# Patient Record
Sex: Male | Born: 1980 | Race: White | Hispanic: No | Marital: Single | Smoking: Former smoker
Health system: Southern US, Community
[De-identification: ages and names within clinical notes are randomized; demographics above are authoritative.]

## PROBLEM LIST (undated history)

## (undated) DIAGNOSIS — F988 Other specified behavioral and emotional disorders with onset usually occurring in childhood and adolescence: Secondary | ICD-10-CM

## (undated) DIAGNOSIS — I10 Essential (primary) hypertension: Secondary | ICD-10-CM

## (undated) DIAGNOSIS — L409 Psoriasis, unspecified: Secondary | ICD-10-CM

## (undated) DIAGNOSIS — F419 Anxiety disorder, unspecified: Secondary | ICD-10-CM

## (undated) HISTORY — PX: OTHER SURGICAL HISTORY: SHX169

---
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
## 2004-04-28 ENCOUNTER — Emergency Department (HOSPITAL_COMMUNITY): Admission: EM | Admit: 2004-04-28 | Discharge: 2004-04-29 | Payer: Self-pay | Admitting: Emergency Medicine

## 2004-04-29 ENCOUNTER — Emergency Department (HOSPITAL_COMMUNITY): Admission: EM | Admit: 2004-04-29 | Discharge: 2004-04-30 | Payer: Self-pay | Admitting: Emergency Medicine

## 2004-04-30 ENCOUNTER — Emergency Department (HOSPITAL_COMMUNITY): Admission: EM | Admit: 2004-04-30 | Discharge: 2004-04-30 | Payer: Self-pay | Admitting: Emergency Medicine

## 2004-05-01 ENCOUNTER — Inpatient Hospital Stay (HOSPITAL_COMMUNITY): Admission: EM | Admit: 2004-05-01 | Discharge: 2004-05-04 | Payer: Self-pay | Admitting: Emergency Medicine

## 2004-05-03 ENCOUNTER — Encounter (INDEPENDENT_AMBULATORY_CARE_PROVIDER_SITE_OTHER): Payer: Self-pay | Admitting: *Deleted

## 2007-06-19 ENCOUNTER — Emergency Department (HOSPITAL_COMMUNITY): Admission: EM | Admit: 2007-06-19 | Discharge: 2007-06-19 | Payer: Self-pay | Admitting: Emergency Medicine

## 2008-06-25 ENCOUNTER — Emergency Department (HOSPITAL_COMMUNITY): Admission: EM | Admit: 2008-06-25 | Discharge: 2008-06-25 | Payer: Self-pay | Admitting: Emergency Medicine

## 2008-06-25 ENCOUNTER — Encounter (INDEPENDENT_AMBULATORY_CARE_PROVIDER_SITE_OTHER): Payer: Self-pay | Admitting: *Deleted

## 2008-06-28 ENCOUNTER — Ambulatory Visit: Payer: Self-pay | Admitting: Gastroenterology

## 2008-06-29 ENCOUNTER — Inpatient Hospital Stay (HOSPITAL_COMMUNITY): Admission: EM | Admit: 2008-06-29 | Discharge: 2008-07-01 | Payer: Self-pay | Admitting: Emergency Medicine

## 2008-06-30 ENCOUNTER — Telehealth: Payer: Self-pay | Admitting: Gastroenterology

## 2008-07-01 ENCOUNTER — Inpatient Hospital Stay (HOSPITAL_COMMUNITY): Admission: EM | Admit: 2008-07-01 | Discharge: 2008-07-03 | Payer: Self-pay | Admitting: Emergency Medicine

## 2008-07-04 ENCOUNTER — Encounter: Payer: Self-pay | Admitting: Gastroenterology

## 2008-07-13 ENCOUNTER — Encounter (INDEPENDENT_AMBULATORY_CARE_PROVIDER_SITE_OTHER): Payer: Self-pay | Admitting: *Deleted

## 2008-07-17 ENCOUNTER — Ambulatory Visit: Payer: Self-pay | Admitting: Gastroenterology

## 2008-08-07 ENCOUNTER — Emergency Department (HOSPITAL_COMMUNITY): Admission: EM | Admit: 2008-08-07 | Discharge: 2008-08-07 | Payer: Self-pay | Admitting: Emergency Medicine

## 2008-08-08 ENCOUNTER — Emergency Department (HOSPITAL_COMMUNITY): Admission: EM | Admit: 2008-08-08 | Discharge: 2008-08-08 | Payer: Self-pay | Admitting: Emergency Medicine

## 2008-08-08 ENCOUNTER — Emergency Department (HOSPITAL_COMMUNITY): Admission: EM | Admit: 2008-08-08 | Discharge: 2008-08-09 | Payer: Self-pay | Admitting: Emergency Medicine

## 2008-08-09 ENCOUNTER — Ambulatory Visit: Payer: Self-pay | Admitting: Internal Medicine

## 2008-08-09 ENCOUNTER — Inpatient Hospital Stay (HOSPITAL_COMMUNITY): Admission: EM | Admit: 2008-08-09 | Discharge: 2008-08-10 | Payer: Self-pay | Admitting: *Deleted

## 2008-08-11 ENCOUNTER — Emergency Department (HOSPITAL_COMMUNITY): Admission: EM | Admit: 2008-08-11 | Discharge: 2008-08-11 | Payer: Self-pay | Admitting: Emergency Medicine

## 2008-09-12 ENCOUNTER — Emergency Department (HOSPITAL_COMMUNITY): Admission: EM | Admit: 2008-09-12 | Discharge: 2008-09-12 | Payer: Self-pay | Admitting: Emergency Medicine

## 2008-09-14 ENCOUNTER — Emergency Department (HOSPITAL_COMMUNITY): Admission: EM | Admit: 2008-09-14 | Discharge: 2008-09-14 | Payer: Self-pay | Admitting: Emergency Medicine

## 2008-11-03 ENCOUNTER — Emergency Department (HOSPITAL_COMMUNITY): Admission: EM | Admit: 2008-11-03 | Discharge: 2008-11-03 | Payer: Self-pay | Admitting: Emergency Medicine

## 2008-11-04 ENCOUNTER — Emergency Department (HOSPITAL_COMMUNITY): Admission: EM | Admit: 2008-11-04 | Discharge: 2008-11-04 | Payer: Self-pay | Admitting: Emergency Medicine

## 2008-11-08 ENCOUNTER — Ambulatory Visit (HOSPITAL_COMMUNITY): Admission: RE | Admit: 2008-11-08 | Discharge: 2008-11-08 | Payer: Self-pay | Admitting: Gastroenterology

## 2009-12-10 ENCOUNTER — Emergency Department (HOSPITAL_COMMUNITY): Admission: EM | Admit: 2009-12-10 | Discharge: 2009-12-10 | Payer: Self-pay | Admitting: Family Medicine

## 2010-06-02 ENCOUNTER — Emergency Department (HOSPITAL_COMMUNITY)
Admission: EM | Admit: 2010-06-02 | Discharge: 2010-06-02 | Payer: Self-pay | Source: Home / Self Care | Admitting: Emergency Medicine

## 2010-09-09 LAB — DIFFERENTIAL
Basophils Absolute: 0 10*3/uL (ref 0.0–0.1)
Basophils Absolute: 0 10*3/uL (ref 0.0–0.1)
Basophils Relative: 0 % (ref 0–1)
Eosinophils Absolute: 0 10*3/uL (ref 0.0–0.7)
Eosinophils Relative: 0 % (ref 0–5)
Eosinophils Relative: 0 % (ref 0–5)
Lymphocytes Relative: 19 % (ref 12–46)
Lymphocytes Relative: 14 % (ref 12–46)
Lymphs Abs: 1.8 10*3/uL (ref 0.7–4.0)
Lymphs Abs: 1.3 10*3/uL (ref 0.7–4.0)
Monocytes Absolute: 0.3 10*3/uL (ref 0.1–1.0)
Monocytes Relative: 4 % (ref 3–12)
Neutro Abs: 7.2 10*3/uL (ref 1.7–7.7)
Neutro Abs: 7.4 10*3/uL (ref 1.7–7.7)
Neutrophils Relative %: 77 % (ref 43–77)

## 2010-09-09 LAB — CBC
HCT: 41.5 % (ref 39.0–52.0)
HCT: 42.1 % (ref 39.0–52.0)
Hemoglobin: 14.6 g/dL (ref 13.0–17.0)
Hemoglobin: 14.4 g/dL (ref 13.0–17.0)
MCHC: 35.1 g/dL (ref 30.0–36.0)
MCHC: 34.2 g/dL (ref 30.0–36.0)
MCV: 91.3 fL (ref 78.0–100.0)
MCV: 91.4 fL (ref 78.0–100.0)
Platelets: 281 10*3/uL (ref 150–400)
Platelets: 297 10*3/uL (ref 150–400)
RBC: 4.54 MIL/uL (ref 4.22–5.81)
RBC: 4.61 MIL/uL (ref 4.22–5.81)
RDW: 13.1 % (ref 11.5–15.5)
RDW: 12.8 % (ref 11.5–15.5)
WBC: 9.4 10*3/uL (ref 4.0–10.5)
WBC: 9.1 10*3/uL (ref 4.0–10.5)

## 2010-09-09 LAB — COMPREHENSIVE METABOLIC PANEL
ALT: 14 U/L (ref 0–53)
ALT: 16 U/L (ref 0–53)
AST: 22 U/L (ref 0–37)
AST: 21 U/L (ref 0–37)
Albumin: 4.8 g/dL (ref 3.5–5.2)
Albumin: 4.8 g/dL (ref 3.5–5.2)
Alkaline Phosphatase: 52 U/L (ref 39–117)
Alkaline Phosphatase: 51 U/L (ref 39–117)
BUN: 14 mg/dL (ref 6–23)
BUN: 16 mg/dL (ref 6–23)
CO2: 23 mEq/L (ref 19–32)
CO2: 24 mEq/L (ref 19–32)
Calcium: 10 mg/dL (ref 8.4–10.5)
Calcium: 9.8 mg/dL (ref 8.4–10.5)
Chloride: 107 mEq/L (ref 96–112)
Chloride: 108 mEq/L (ref 96–112)
Creatinine, Ser: 0.96 mg/dL (ref 0.4–1.5)
Creatinine, Ser: 1.11 mg/dL (ref 0.4–1.5)
GFR calc Af Amer: >60 mL/min (ref >60)
GFR calc Af Amer: >60 mL/min (ref >60)
GFR calc non Af Amer: >60 mL/min (ref >60)
GFR calc non Af Amer: >60 mL/min (ref >60)
Glucose, Bld: 124 mg/dL — ABNORMAL HIGH (ref 70–99)
Glucose, Bld: 125 mg/dL — ABNORMAL HIGH (ref 70–99)
Potassium: 3.2 mEq/L — ABNORMAL LOW (ref 3.5–5.1)
Potassium: 3.7 mEq/L (ref 3.5–5.1)
Sodium: 140 mEq/L (ref 135–145)
Sodium: 142 mEq/L (ref 135–145)
Total Bilirubin: 1.2 mg/dL (ref 0.3–1.2)
Total Bilirubin: 1 mg/dL (ref 0.3–1.2)
Total Protein: 7.4 g/dL (ref 6.0–8.3)
Total Protein: 7.2 g/dL (ref 6.0–8.3)

## 2010-09-09 LAB — URINALYSIS, ROUTINE W REFLEX MICROSCOPIC
Bilirubin Urine: NEGATIVE
Glucose, UA: NEGATIVE mg/dL
Hgb urine dipstick: NEGATIVE
Ketones, ur: >80 mg/dL — AB
Leukocytes, UA: NEGATIVE
Nitrite: NEGATIVE
Protein, ur: 30 mg/dL — AB
Specific Gravity, Urine: 1.03 (ref 1.005–1.030)
Urobilinogen, UA: 1 mg/dL (ref 0.0–1.0)
pH: 8.5 — ABNORMAL HIGH (ref 5.0–8.0)

## 2010-09-09 LAB — URINE MICROSCOPIC-ADD ON

## 2010-09-09 LAB — LIPASE, BLOOD
Lipase: 17 U/L (ref 11–59)
Lipase: 20 U/L (ref 11–59)

## 2010-09-11 LAB — COMPREHENSIVE METABOLIC PANEL
Albumin: 4.3 g/dL (ref 3.5–5.2)
Alkaline Phosphatase: 43 U/L (ref 39–117)
BUN: 8 mg/dL (ref 6–23)
CO2: 27 mEq/L (ref 19–32)
Chloride: 105 mEq/L (ref 96–112)
Creatinine, Ser: 0.87 mg/dL (ref 0.4–1.5)
GFR calc non Af Amer: >60 mL/min (ref >60)
Glucose, Bld: 108 mg/dL — ABNORMAL HIGH (ref 70–99)
Potassium: 3.7 mEq/L (ref 3.5–5.1)
Total Bilirubin: 0.8 mg/dL (ref 0.3–1.2)

## 2010-09-11 LAB — URINALYSIS, ROUTINE W REFLEX MICROSCOPIC
Hgb urine dipstick: NEGATIVE
Nitrite: NEGATIVE
Protein, ur: NEGATIVE mg/dL
Urobilinogen, UA: 0.2 mg/dL (ref 0.0–1.0)

## 2010-09-11 LAB — CBC
HCT: 39 % (ref 39.0–52.0)
Hemoglobin: 13.6 g/dL (ref 13.0–17.0)
MCV: 92.8 fL (ref 78.0–100.0)
RBC: 4.2 MIL/uL — ABNORMAL LOW (ref 4.22–5.81)
WBC: 8.3 10*3/uL (ref 4.0–10.5)

## 2010-09-11 LAB — POCT I-STAT, CHEM 8
BUN: 12 mg/dL (ref 6–23)
Chloride: 108 mEq/L (ref 96–112)
HCT: 49 % (ref 39.0–52.0)
Potassium: 3.8 mEq/L (ref 3.5–5.1)

## 2010-09-11 LAB — DIFFERENTIAL
Basophils Absolute: 0 10*3/uL (ref 0.0–0.1)
Basophils Relative: 0 % (ref 0–1)
Monocytes Absolute: 0.5 10*3/uL (ref 0.1–1.0)
Neutro Abs: 5.7 10*3/uL (ref 1.7–7.7)
Neutrophils Relative %: 69 % (ref 43–77)

## 2010-09-11 LAB — LIPASE, BLOOD: Lipase: 20 U/L (ref 11–59)

## 2010-09-12 LAB — DIFFERENTIAL
Eosinophils Relative: 0 % (ref 0–5)
Lymphocytes Relative: 21 % (ref 12–46)
Lymphocytes Relative: 13 % (ref 12–46)
Lymphocytes Relative: 14 % (ref 12–46)
Lymphs Abs: 1.6 10*3/uL (ref 0.7–4.0)
Lymphs Abs: 1.1 10*3/uL (ref 0.7–4.0)
Lymphs Abs: 1.2 10*3/uL (ref 0.7–4.0)
Monocytes Absolute: 0.6 10*3/uL (ref 0.1–1.0)
Monocytes Absolute: 0.6 10*3/uL (ref 0.1–1.0)
Monocytes Relative: 8 % (ref 3–12)
Monocytes Relative: 7 % (ref 3–12)
Monocytes Relative: 7 % (ref 3–12)
Neutro Abs: 6.1 10*3/uL (ref 1.7–7.7)
Neutro Abs: 7.4 10*3/uL (ref 1.7–7.7)
Neutrophils Relative %: 79 % — ABNORMAL HIGH (ref 43–77)
Neutrophils Relative %: 80 % — ABNORMAL HIGH (ref 43–77)

## 2010-09-12 LAB — BASIC METABOLIC PANEL
BUN: 8 mg/dL (ref 6–23)
BUN: 9 mg/dL (ref 6–23)
CO2: 26 mEq/L (ref 19–32)
CO2: 22 mEq/L (ref 19–32)
Calcium: 8.5 mg/dL (ref 8.4–10.5)
Calcium: 9.7 mg/dL (ref 8.4–10.5)
Chloride: 106 mEq/L (ref 96–112)
Creatinine, Ser: 0.86 mg/dL (ref 0.4–1.5)
GFR calc Af Amer: >60 mL/min (ref >60)
GFR calc non Af Amer: >60 mL/min (ref >60)
GFR calc non Af Amer: >60 mL/min (ref >60)
GFR calc non Af Amer: >60 mL/min (ref >60)
Glucose, Bld: 99 mg/dL (ref 70–99)
Potassium: 3 mEq/L — ABNORMAL LOW (ref 3.5–5.1)
Potassium: 3.4 mEq/L — ABNORMAL LOW (ref 3.5–5.1)
Sodium: 138 mEq/L (ref 135–145)
Sodium: 137 mEq/L (ref 135–145)

## 2010-09-12 LAB — URINALYSIS, ROUTINE W REFLEX MICROSCOPIC
Glucose, UA: NEGATIVE mg/dL
Leukocytes, UA: NEGATIVE
Nitrite: NEGATIVE
Protein, ur: 100 mg/dL — AB
pH: 7 (ref 5.0–8.0)

## 2010-09-12 LAB — CBC
HCT: 35.7 % — ABNORMAL LOW (ref 39.0–52.0)
HCT: 42 % (ref 39.0–52.0)
Hemoglobin: 14.6 g/dL (ref 13.0–17.0)
Hemoglobin: 14.6 g/dL (ref 13.0–17.0)
MCHC: 35.9 g/dL (ref 30.0–36.0)
MCHC: 34.7 g/dL (ref 30.0–36.0)
MCHC: 34.8 g/dL (ref 30.0–36.0)
MCV: 92 fL (ref 78.0–100.0)
MCV: 92.3 fL (ref 78.0–100.0)
MCV: 92.2 fL (ref 78.0–100.0)
Platelets: 234 10*3/uL (ref 150–400)
Platelets: 264 10*3/uL (ref 150–400)
RBC: 4.56 MIL/uL (ref 4.22–5.81)
RDW: 12.8 % (ref 11.5–15.5)
WBC: 7.4 10*3/uL (ref 4.0–10.5)
WBC: 7.6 10*3/uL (ref 4.0–10.5)
WBC: 7.7 10*3/uL (ref 4.0–10.5)

## 2010-09-12 LAB — COMPREHENSIVE METABOLIC PANEL
AST: 21 U/L (ref 0–37)
Albumin: 4.3 g/dL (ref 3.5–5.2)
BUN: 12 mg/dL (ref 6–23)
BUN: 12 mg/dL (ref 6–23)
Calcium: 9 mg/dL (ref 8.4–10.5)
Calcium: 9.5 mg/dL (ref 8.4–10.5)
Chloride: 104 mEq/L (ref 96–112)
Creatinine, Ser: 0.92 mg/dL (ref 0.4–1.5)
Creatinine, Ser: 0.97 mg/dL (ref 0.4–1.5)
GFR calc Af Amer: >60 mL/min (ref >60)
Glucose, Bld: 107 mg/dL — ABNORMAL HIGH (ref 70–99)
Sodium: 137 mEq/L (ref 135–145)
Total Protein: 6.8 g/dL (ref 6.0–8.3)
Total Protein: 7 g/dL (ref 6.0–8.3)

## 2010-09-12 LAB — RAPID URINE DRUG SCREEN, HOSP PERFORMED
Amphetamines: NOT DETECTED
Amphetamines: NOT DETECTED
Barbiturates: POSITIVE — AB
Benzodiazepines: POSITIVE — AB
Cocaine: NOT DETECTED
Tetrahydrocannabinol: POSITIVE — AB

## 2010-09-12 LAB — ETHANOL: Alcohol, Ethyl (B): <5 mg/dL (ref 0–10)

## 2010-09-12 LAB — URINE MICROSCOPIC-ADD ON

## 2010-09-12 LAB — PROTIME-INR: INR: 1 (ref 0.00–1.49)

## 2010-09-12 LAB — LIPASE, BLOOD: Lipase: 15 U/L (ref 11–59)

## 2010-09-12 LAB — GLUCOSE, CAPILLARY: Glucose-Capillary: 92 mg/dL (ref 70–99)

## 2010-09-16 LAB — COMPREHENSIVE METABOLIC PANEL
ALT: 19 U/L (ref 0–53)
ALT: 12 U/L (ref 0–53)
ALT: 16 U/L (ref 0–53)
AST: 21 U/L (ref 0–37)
AST: 23 U/L (ref 0–37)
AST: 27 U/L (ref 0–37)
Albumin: 3.5 g/dL (ref 3.5–5.2)
Albumin: 3.6 g/dL (ref 3.5–5.2)
Albumin: 4.8 g/dL (ref 3.5–5.2)
Alkaline Phosphatase: 35 U/L — ABNORMAL LOW (ref 39–117)
BUN: 7 mg/dL (ref 6–23)
CO2: 28 mEq/L (ref 19–32)
CO2: 24 mEq/L (ref 19–32)
Calcium: 9.9 mg/dL (ref 8.4–10.5)
Calcium: 9.8 mg/dL (ref 8.4–10.5)
Calcium: 9.8 mg/dL (ref 8.4–10.5)
Chloride: 105 mEq/L (ref 96–112)
Chloride: 104 mEq/L (ref 96–112)
Chloride: 106 mEq/L (ref 96–112)
Creatinine, Ser: 0.92 mg/dL (ref 0.4–1.5)
Creatinine, Ser: 0.99 mg/dL (ref 0.4–1.5)
Creatinine, Ser: 1.02 mg/dL (ref 0.4–1.5)
Creatinine, Ser: 1.05 mg/dL (ref 0.4–1.5)
GFR calc Af Amer: >60 mL/min (ref >60)
GFR calc Af Amer: >60 mL/min (ref >60)
GFR calc Af Amer: >60 mL/min (ref >60)
GFR calc non Af Amer: >60 mL/min (ref >60)
GFR calc non Af Amer: >60 mL/min (ref >60)
GFR calc non Af Amer: >60 mL/min (ref >60)
Glucose, Bld: 115 mg/dL — ABNORMAL HIGH (ref 70–99)
Potassium: 3.3 mEq/L — ABNORMAL LOW (ref 3.5–5.1)
Sodium: 140 mEq/L (ref 135–145)
Sodium: 137 mEq/L (ref 135–145)
Sodium: 138 mEq/L (ref 135–145)
Sodium: 139 mEq/L (ref 135–145)
Total Bilirubin: 1.3 mg/dL — ABNORMAL HIGH (ref 0.3–1.2)
Total Bilirubin: 1.2 mg/dL (ref 0.3–1.2)
Total Protein: 5.2 g/dL — ABNORMAL LOW (ref 6.0–8.3)
Total Protein: 5.6 g/dL — ABNORMAL LOW (ref 6.0–8.3)
Total Protein: 6.9 g/dL (ref 6.0–8.3)
Total Protein: 7.1 g/dL (ref 6.0–8.3)

## 2010-09-16 LAB — DIFFERENTIAL
Basophils Absolute: 0 10*3/uL (ref 0.0–0.1)
Basophils Absolute: 0 10*3/uL (ref 0.0–0.1)
Eosinophils Absolute: 0.1 10*3/uL (ref 0.0–0.7)
Eosinophils Absolute: 0 10*3/uL (ref 0.0–0.7)
Eosinophils Relative: 1 % (ref 0–5)
Eosinophils Relative: 0 % (ref 0–5)
Lymphocytes Relative: 23 % (ref 12–46)
Lymphocytes Relative: 28 % (ref 12–46)
Lymphs Abs: 1.8 10*3/uL (ref 0.7–4.0)
Lymphs Abs: 1.8 10*3/uL (ref 0.7–4.0)
Lymphs Abs: 2.4 10*3/uL (ref 0.7–4.0)
Monocytes Absolute: 0.6 10*3/uL (ref 0.1–1.0)
Monocytes Absolute: 0.7 10*3/uL (ref 0.1–1.0)
Monocytes Relative: 7 % (ref 3–12)
Neutro Abs: 5.4 10*3/uL (ref 1.7–7.7)
Neutrophils Relative %: 54 % (ref 43–77)

## 2010-09-16 LAB — CBC
HCT: 37.1 % — ABNORMAL LOW (ref 39.0–52.0)
HCT: 39.6 % (ref 39.0–52.0)
Hemoglobin: 15.6 g/dL (ref 13.0–17.0)
Hemoglobin: 12.4 g/dL — ABNORMAL LOW (ref 13.0–17.0)
MCHC: 34 g/dL (ref 30.0–36.0)
MCHC: 34.6 g/dL (ref 30.0–36.0)
MCHC: 34.6 g/dL (ref 30.0–36.0)
MCHC: 34.9 g/dL (ref 30.0–36.0)
MCHC: 34.9 g/dL (ref 30.0–36.0)
MCV: 92.3 fL (ref 78.0–100.0)
MCV: 91.1 fL (ref 78.0–100.0)
MCV: 91.7 fL (ref 78.0–100.0)
MCV: 91.9 fL (ref 78.0–100.0)
MCV: 92.4 fL (ref 78.0–100.0)
Platelets: 228 10*3/uL (ref 150–400)
Platelets: 243 10*3/uL (ref 150–400)
Platelets: 274 10*3/uL (ref 150–400)
Platelets: 281 10*3/uL (ref 150–400)
RBC: 4.98 MIL/uL (ref 4.22–5.81)
RBC: 4.72 MIL/uL (ref 4.22–5.81)
RDW: 12.8 % (ref 11.5–15.5)
RDW: 12.9 % (ref 11.5–15.5)
RDW: 13 % (ref 11.5–15.5)
RDW: 13.2 % (ref 11.5–15.5)
RDW: 13.2 % (ref 11.5–15.5)
WBC: 4.9 10*3/uL (ref 4.0–10.5)
WBC: 8.6 10*3/uL (ref 4.0–10.5)

## 2010-09-16 LAB — URINALYSIS, ROUTINE W REFLEX MICROSCOPIC
Bilirubin Urine: NEGATIVE
Glucose, UA: NEGATIVE mg/dL
Glucose, UA: NEGATIVE mg/dL
Glucose, UA: NEGATIVE mg/dL
Hgb urine dipstick: NEGATIVE
Hgb urine dipstick: NEGATIVE
Hgb urine dipstick: NEGATIVE
Ketones, ur: >80 mg/dL — AB
Ketones, ur: >80 mg/dL — AB
Nitrite: NEGATIVE
Nitrite: NEGATIVE
Protein, ur: NEGATIVE mg/dL
Protein, ur: NEGATIVE mg/dL
Specific Gravity, Urine: 1.03 (ref 1.005–1.030)
Specific Gravity, Urine: 1.025 (ref 1.005–1.030)
Specific Gravity, Urine: 1.038 — ABNORMAL HIGH (ref 1.005–1.030)
Urobilinogen, UA: 1 mg/dL (ref 0.0–1.0)
pH: 6.5 (ref 5.0–8.0)
pH: 6 (ref 5.0–8.0)

## 2010-09-16 LAB — BASIC METABOLIC PANEL
BUN: 12 mg/dL (ref 6–23)
BUN: 5 mg/dL — ABNORMAL LOW (ref 6–23)
BUN: 8 mg/dL (ref 6–23)
CO2: 24 mEq/L (ref 19–32)
CO2: 27 mEq/L (ref 19–32)
Calcium: 9.4 mg/dL (ref 8.4–10.5)
Chloride: 101 mEq/L (ref 96–112)
Chloride: 101 mEq/L (ref 96–112)
Creatinine, Ser: 0.93 mg/dL (ref 0.4–1.5)
Creatinine, Ser: 0.94 mg/dL (ref 0.4–1.5)
GFR calc Af Amer: >60 mL/min (ref >60)
GFR calc non Af Amer: >60 mL/min (ref >60)
GFR calc non Af Amer: >60 mL/min (ref >60)
GFR calc non Af Amer: >60 mL/min (ref >60)
Glucose, Bld: 101 mg/dL — ABNORMAL HIGH (ref 70–99)
Glucose, Bld: 112 mg/dL — ABNORMAL HIGH (ref 70–99)
Glucose, Bld: 115 mg/dL — ABNORMAL HIGH (ref 70–99)
Potassium: 3.4 mEq/L — ABNORMAL LOW (ref 3.5–5.1)
Potassium: 3.4 mEq/L — ABNORMAL LOW (ref 3.5–5.1)
Sodium: 135 mEq/L (ref 135–145)
Sodium: 137 mEq/L (ref 135–145)

## 2010-09-16 LAB — MAGNESIUM: Magnesium: 1.9 mg/dL (ref 1.5–2.5)

## 2010-09-16 LAB — URINE DRUGS OF ABUSE SCREEN W ALC, ROUTINE (REF LAB)
Amphetamine Screen, Ur: NEGATIVE
Creatinine,U: 200.7 mg/dL
Methadone: NEGATIVE
Propoxyphene: NEGATIVE

## 2010-09-16 LAB — LIPASE, BLOOD
Lipase: 27 U/L (ref 11–59)
Lipase: 36 U/L (ref 11–59)

## 2010-09-16 LAB — OPIATE, QUANTITATIVE, URINE
Codeine Urine: NEGATIVE ng/mL
Morphine, Confirm: 1400 ng/mL
Oxymorphone: 150 ng/mL

## 2010-09-16 LAB — SEDIMENTATION RATE: Sed Rate: 1 mm/hr (ref 0–16)

## 2010-09-16 LAB — BILIRUBIN, DIRECT: Bilirubin, Direct: 0.3 mg/dL (ref 0.0–0.3)

## 2010-09-16 LAB — H. PYLORI ANTIBODY, IGG: H Pylori IgG: 0.4 {ISR}

## 2010-09-16 LAB — GLUCOSE, CAPILLARY: Glucose-Capillary: 93 mg/dL (ref 70–99)

## 2010-09-16 LAB — PHOSPHORUS
Phosphorus: 3 mg/dL (ref 2.3–4.6)
Phosphorus: 3.5 mg/dL (ref 2.3–4.6)

## 2010-09-17 LAB — CBC
HCT: 38 % — ABNORMAL LOW (ref 39.0–52.0)
Platelets: 247 10*3/uL (ref 150–400)
RDW: 13.2 % (ref 11.5–15.5)

## 2010-09-17 LAB — BASIC METABOLIC PANEL
BUN: 11 mg/dL (ref 6–23)
GFR calc non Af Amer: >60 mL/min (ref >60)
Glucose, Bld: 118 mg/dL — ABNORMAL HIGH (ref 70–99)
Potassium: 3.8 mEq/L (ref 3.5–5.1)

## 2010-10-15 NOTE — H&P (Signed)
Preston Munoz, Preston Munoz         ACCOUNT NO.:  [id number]   MEDICAL RECORD NO.:  [id number]          PATIENT TYPE:  EMS   LOCATION:  ED                           FACILITY:  [facility]   PHYSICIAN:  Lamar Laundry, MD      DATE OF BIRTH:  Oct 13, 1980   DATE OF ADMISSION:  07/01/2008  DATE OF DISCHARGE:                              HISTORY & PHYSICAL   CHIEF COMPLAINT:  Nausea, vomiting.   HISTORY OF PRESENT ILLNESS:  Preston Munoz is a 30 year old male who was  just discharged this morning from the hospital after an admission for  nausea, vomiting, and abdominal pain.  The patient returns with the same  complaints today.  He states that he was feeling well when he was  discharged this morning and subsequently ate some applesauce and then  had 4-5 episodes of clear emesis.  He states that with the emesis he  started experiencing a strong pain in his left upper quadrant which he  rates as 9/10 and that this pain has persisted since these vomiting  episodes.  As a result of the pain and the continuation of nausea and  vomiting, he returns to the ER.   During his recent admission, he was seen by gastroenterology and an EGD  was performed.  The EGD revealed grade 1 esophagitis which was not felt  to be severe enough to cause his symptoms.  Additionally, he had a CT of  his abdomen and pelvis that was recently done on January 24 which did  not reveal any acute findings that could explain his symptoms.  A  gastric emptying scan was also attempted during the recent admission and  the patient was unable to complete the task.  He was additionally seen  by Dr. Antonietta Breach of psychiatry and a diagnosis of anxiety  disorder, not otherwise specified was considered.  He was started on  Effexor for this and was encouraged to see a psychotherapist on an  outpatient basis for cognitive behavioral therapy.   ALLERGIES:  No known drug allergies.   MEDICATIONS INCLUDE:  1. Effexor 37.5 mg p.o. daily.  2. Prilosec 20 mg p.o. daily.  3. Reglan 5 mg p.o. b.i.d. p.r.n.  4. Potassium chloride 10 mEq p.o. daily for 1 week.   PAST MEDICAL HISTORY:  1. Significant for gastritis.  2. Depression.  3. Anxiety.  4. Marijuana use.  5. Gastroesophageal reflux disease for the last 5 years.   SOCIAL HISTORY:  He lives in an apartment with his friend.  He is  unemployed.  He is a former brief smoker.  He denies use of alcohol.  He  reports using marijuana every few days since the age of 78.  He denies  the use of other drugs.   FAMILY HISTORY:  The patient is adopted and does not know any history  with respect to his birth parents.   REVIEW OF SYSTEMS:  Ten-point review of systems was performed and is  negative other than complaints of nausea, vomiting and abdominal pain as  previously mentioned in the HPI.   PHYSICAL EXAM:  VITAL SIGNS:  Temperature 98.3, blood  pressure 174/127,  pulse 94, respirations 69, 99% on room air.  GENERAL:  This is a young gentleman in no acute distress, awake, alert,  oriented x3.  HEENT EXAM:  Dry mucous membranes.  NECK:  No jugular venous distention.  CARDIOVASCULAR EXAM:  Regular rate and rhythm, S1-S2, no murmurs.  LUNGS:  Clear to auscultation bilaterally.  No crackles, wheezes or  rhonchi.  ABDOMEN:  Soft with some mild tenderness in the left upper quadrant.  Normal bowel sounds.  EXTREMITIES:  No pretibial edema.  NEUROLOGIC:  Awake, alert, oriented x3.  No focal motor or sensory  deficits.  SKIN:  No rashes.   LABS:  WBC 8, hemoglobin 15, hematocrit 43, platelets 292.  The basic  metabolic panel this evening is not back yet but this morning his sodium  was 137, potassium 3.6, chloride 101, bicarb 27, BUN 8, creatinine 0.9,  glucose 115, calcium 9.4.   ASSESSMENT AND PLAN:  This is 30 year old gentleman with grade 1  esophagitis and known anxiety and depression who returns to the ER with  nausea, vomiting, abdominal pain.   1. Nausea, vomiting  and abdominal pain:  At present we are still      waiting for labs including a BMP and liver function tests.  We will      check an abdominal x-ray.  In the meantime we will provide the      patient with p.r.n. IV Phenergan, Reglan and morphine.  We will      give him IV fluids to aid with hydration.  2. Hypertension:  This is likely secondary to pain.  We will monitor      this patient's blood pressures and try to get his pain under      improved control.  3. Gastroesophageal reflux disease and esophagitis:  The patient will      be placed on an IV PPI every 24 hours while he is having      significant issues with nausea and vomiting.      Lamar Laundry, MD  Electronically Signed     HR/MEDQ  D:  07/01/2008  T:  07/01/2008  Job:  [id number]

## 2010-10-15 NOTE — Op Note (Signed)
NAME:  Preston Munoz, Preston Munoz         ACCOUNT NO.:  [id number]   MEDICAL RECORD NO.:  [id number]          PATIENT TYPE:  AMB   LOCATION:  ENDO                         FACILITY:  [facility]   PHYSICIAN:  James L. Malon Kindle., M.D.DATE OF BIRTH:  24-May-1981   DATE OF PROCEDURE:  11/08/2008  DATE OF DISCHARGE:  11/08/2008                               OPERATIVE REPORT   PROCEDURE:  Esophagogastroduodenoscopy.   INDICATION:  Patient with a history of reflux with severe nausea,  vomiting and severe epigastric pain with three previous ER visits.   DESCRIPTION OF PROCEDURE:  Procedure explained to the patient and  consent obtained.  Left lateral decubitus position, Pentax upper  endoscope inserted and advanced.  The distal esophagus was reached.  The  patient had severe ulcerative esophagitis and a widely patent GE  junction.  The esophagitis was located in the bottom of the esophagus at  approximately the bottom third.  The more proximal esophagus was  completely endoscopically normal.  There were multiple linear ulcers  extended down to the GE junction which was widely patent.  The stomach  entered, pylorus identified and passed.  The duodenum including bulb and  second portion normal.  Pyloric channel normal, antrum and body of the  stomach normal in the forward and retroflex view.  The scope was  withdrawn.  Initial findings were confirmed.  The middle and proximal  esophagus were endoscopically normal upon removal.  There was no active  bleeding.  The patient tolerated the procedure well.   ASSESSMENT:  Severe ulcerative esophagitis probably due to reflux.   PLAN:  Will give reflux instruction sheet, see him back in the office in  1 month and have him take proton pump inhibitor twice daily.           ______________________________  Llana Aliment Malon Kindle., M.D.     Waldron Session  D:  11/08/2008  T:  11/09/2008  Job:  [id number]

## 2010-10-15 NOTE — H&P (Signed)
NAME:  Preston Munoz, Preston Munoz         ACCOUNT NO.:  [id number]   MEDICAL RECORD NO.:  [id number]          PATIENT TYPE:  INP   LOCATION:  0105                         FACILITY:  [facility]   PHYSICIAN:  Renee Ramus, MD       DATE OF BIRTH:  01-03-1981   DATE OF ADMISSION:  06/26/2008  DATE OF DISCHARGE:                              HISTORY & PHYSICAL   PRIMARY CARE PHYSICIAN:  The patient does not have a primary care  physician.   HISTORY OF PRESENT ILLNESS:  The patient is a 30 year old male who has  had abdominal pain x several days prior to admission.  The patient has a  5 year history of abdominal pain and was diagnosed approximately 5 years  prior to H. Pylori infection.  The patient said he completed his course  of antibiotics for this but has noted severe increase in abdominal pain  recently.  The patient has not had any followup treatment.  He has not  had any followup with respect to retesting to assure eradication of H.  Pylori.  The patient denies fevers, chills, night sweats. He does report  severe nausea and vomiting.  Denies diarrhea, constipation.  The patient  does use marijuana for symptom relief.  The patient also has problems  with anxiety which he says can compel him to have nausea, vomiting and  diarrhea.  The patient was seen in the emergency department one day  prior to this visit, was sent home but returned with increasing  symptoms.   PAST MEDICAL HISTORY:  1. Gastritis/esophagitis.  2. Depression.  3. Anxiety.   SOCIAL HISTORY:  The patient is a positive marijuana user, but denies  alcohol or tobacco.   FAMILY HISTORY:  Not available.   REVIEW OF SYSTEMS:  All other comprehensive review of systems are  negative.   ALLERGIES:  He has no known drug allergies.   CURRENT MEDICATIONS:  Xanax 0.5 mg p.o. t.i.d. p.r.n. anxiety.   PHYSICAL EXAMINATION:  GENERAL:  This is a well-developed, well-  nourished white male currently in no apparent distress.  VITAL  SIGNS:  Blood pressure 127/70, heart rate 84, respiratory rate 20,  temperature 98.2.  HEENT:  No jugular venous distention or lymphadenopathy.  Oropharynx is  clear.  Mucous membranes are pink and moist.  Tympanic membranes clear  bilaterally.  Pupils equal, round, reactive to light and accommodation.  Extraocular muscles are intact.  CARDIOVASCULAR:  Regular rate and rhythm without murmurs, rubs or  gallops.  PULMONARY:  Lungs are clear to auscultation bilaterally.  ABDOMEN:  Soft, nontender, nondistended without hepatosplenomegaly.  Bowel sounds are present.  He has no rebound or guarding.  EXTREMITIES:  He has no cyanosis, clubbing or edema.  He has good  peripheral pulses, dorsalis pedis pulses and radial arteries.  He is  able to move all extremities.  NEUROLOGICAL:  Cranial nerves II-XII are grossly intact.  He has no  focal neurological deficits.   LABORATORY DATA:  White blood cells 8.6, hemoglobin 15.4, hematocrit  44.5, MCV 91, platelet count 281,000.  Sodium 138, potassium 3.5,  chloride 103, bicarb 24, BUN 11, creatinine  1.0, glucose 104.  Total  bilirubin 1.5.  Urinalysis is concentrated with specific gravity greater  than 1.038 and greater than 80 ketones.   STUDIES:  1. Acute abdominal series with no disease.  2. Abdominal CT scan shows no acute disease.   ASSESSMENT AND PLAN:  1. Gastroesophageal reflux disease with history of peptic ulcer      disease.  Will make patient NPO except for ice chips.  Will treat      with copious IV fluids.  Will treat for pain and nausea and      empirically treat with proton pump inhibitor.  Will check for      serological evidence of Helicobacter Pylori and consider      retreatment for H. Pylori eradication.  2. Anxiety.  Continue Xanax.  3. Disposition:  Patient may require GI follow up after discharge when      stable but would consider discharge after symptoms have resolved.      Patient has no evidence of acute infection and  believe we admitted      him for symptom management only.   History and physical was constructed by reviewing past medical history,  conferring with emergency medical room physician, reviewing the  emergency medical record.  Time spent 1 hour.      Renee Ramus, MD  Electronically Signed     JF/MEDQ  D:  06/26/2008  T:  06/26/2008  Job:  [id number]

## 2010-10-15 NOTE — Discharge Summary (Signed)
NAME:  Preston Munoz, Preston Munoz         ACCOUNT NO.:  [id number]   MEDICAL RECORD NO.:  [id number]          PATIENT TYPE:  INP   LOCATION:  1524                         FACILITY:  [facility]   PHYSICIAN:  Charlestine Massed, MDDATE OF BIRTH:  10/12/1980   DATE OF ADMISSION:  07/01/2008  DATE OF DISCHARGE:  07/03/2008                               DISCHARGE SUMMARY   REASON FOR ADMISSION:  Recurrent nausea, vomiting and abdominal pain.   HOSPITAL COURSE:  A 30 year old gentleman, Preston Munoz, who was  discharged on July 01, 2008, after he was diagnosed with functional  nausea and vomiting secondary to conversion issues as part of his  anxiety disorder who also had a psychiatric evaluation during the  inpatient stay at that time, came back that evening with history of  recurrent nausea and vomiting, and said he could not keep anything down.  The patient when interviewed clearly he stated that he went to his own  father's house after discharge, and he had his dinner, and after 4 hours  after dinner, he was sitting in his room by himself when he experienced  nausea and vomiting.  There was no episode of nausea and vomiting  recorded during the last admission when in the presence of any hospital  personnel, and he always claims that he has nausea and vomiting only  when there is nobody else around.  He does have nausea and vomiting, was  seen by the vomitus in the bathroom during the last admission, but there  is no episode when anybody saw him actually throw up spontaneously.   During the last admission, the patient was thoroughly worked up by  gastroenterology and found that he has grade 1 esophagitis which is very  minimal and possibly secondary to the retching and vomiting, and the  esophagitis was possibly not the cause of his gastric issues.  Cat of  the abdomen done at this admission revealed no abdominal pathology.  The  patient was educated during the last admission that his  problems were  more of a neurotic nature, and he agreed, and he was discharged.   During this admission, patient again was spoken to about this issue. He  says that he does not have a feeling that he is over it, and he denies  any inducing of vomiting.  I spoke with Dr. Jeanie Sewer, psychiatrist,  again, and he was of the opinion that if the patient has vomiting which  is recurrent from which he cannot take care of himself then patient  possibly needs nursing home placement as he is not able to take care of  himself.  I discussed this option for nursing home placement in view of  the recurrent vomiting and dehydration to him.  He refused to go to a  nursing home, and he states he wants to go home, and he will take care  of issues himself.   If patient has similar symptoms again, the patient will have to be  placed in a nursing home, and he does not meet any hospital admission  criteria as his condition has been clearly investigated in the past.  Currently, the  patient is being discharged back home at his own request  as he does not want to go to a nursing home.   The patient was mildly hypokalemic on admission which was corrected with  IV replacement, and currently, the patient is fine.   The patient also takes multiple showers, 7 or 8 times, during a day, and  he states that taking a shower keeps him cool and makes him feel better.  This issue was also discussed with psychiatry who said that it is all  part of his underlying neurotic issues which are possibly due to his  previous psychological problems he has been facing.  He has been  incarcerated in the past, but after he was adopted, he has turned  around, and he has been going to school to study.  Currently, he is a  Physicist, medical.   DISCHARGE DIAGNOSES:  1. 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  The      patient needs nursing home admission as he cannot take care of      himself, and the patient has  refused nursing home admission.  2. Esophagitis secondary to recurrent vomiting and retching.  3. Anxiety disorder, not otherwise specified.  Depression to be ruled      out.  4. History of marijuana use.   DISCHARGE MEDICATIONS:  1. Effexor 37.5 mg p.o. daily.  The patient needs to be followed up      with psychiatry every week, when his dosage of Effexor needs to be      doubled, as Effexor is capable of increasing blood pressure, and      psychiatric followup is essential.  2. Prilosec 20 mg p.o. daily.  3. BuSpar 7.5 mg p.o. b.i.d.  4. Reglan 5 mg p.o. b.i.d. p.r.n. for vomiting.  5. Potassium chloride 10 mEq p.o. daily with meals.   DISCHARGE INSTRUCTIONS:  1. Needs to follow up with psychiatry in the psychiatric clinic in 1      week.  2. Follow up with psychotherapy as was set up with the discharge on      July 01, 2008.  3. Adhere to medications.  4. If patient has recurrent nausea and vomiting, then he needs to go      to a nursing home for a stay there until his issues are solved-      both through psychotherapy and cognitive counseling.   The patient is being discharged home.   Total of 45 minutes was spent on this discharge.      Charlestine Massed, MD  Electronically Signed     UT/MEDQ  D:  07/03/2008  T:  07/03/2008  Job:  [id number]   cc:   Antonietta Breach, M.D.

## 2010-10-15 NOTE — Consult Note (Signed)
NAME:  Preston Munoz, Preston Munoz         ACCOUNT NO.:  [id number]   MEDICAL RECORD NO.:  [id number]          PATIENT TYPE:  INP   LOCATION:  1524                         FACILITY:  [facility]   PHYSICIAN:  Antonietta Breach, M.D.  DATE OF BIRTH:  1981-01-11   DATE OF CONSULTATION:  06/30/2008  DATE OF DISCHARGE:                                 CONSULTATION   REQUESTING PHYSICIAN:  [facility].   REASON FOR CONSULTATION:  Anxiety, rule out somatoform disorder.   HISTORY OF PRESENT ILLNESS:  Mr. Cavon Nicolls is a 30 year old  male admitted to the [facility] on June 26, 2008 with  abdominal pain, nausea and vomiting.   Mr. Heider has chronic several year history of excessive worry, feeling  on edge, muscle tension with intermittent periods of nausea and  vomiting.  He describes constructive future goals, intact interests.  He  has no thoughts of harming himself or others.  He has no delusions or  hallucinations.  His orientation and memory function are intact.   He is cooperative with staff and health care.   He states that marijuana has helped his symptoms and results in  increased appetite, and he uses marijuana on a regular basis.   PAST PSYCHIATRIC HISTORY:  No history of increased energy or decreased  need for sleep.   No history of suicide attempts.  He did have some difficulty and  friction in his parental relationship when he was in the teens, and he  did undergo psychiatric admission.  He also was sent to jail at age 40  in [place] for burglary.   He has been following the law and out of social behavioral disturbances  for over 6 years now.   He does continue to use marijuana as above.  He avoids all other  recreational substances given that they do upset his stomach.   He has no suicide attempts.   He has been treated on a number of SSRIs including Zoloft, Paxil, and  Lexapro.  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  He was treated with Remeron in jail, and had  a severe allergic reaction with swelling and redness.  He has never been  on venlafaxine.   He does describe a history of a 2-week period of depressed mood.  No  current depression.   No family psychiatric history.   SOCIAL HISTORY:  Mr. Grantz has no children.  He is enrolled at a  Architectural technologist.  He lives  with a roommate who is a male friend.  They are not romantically  involved.  However, he did recently break up with a girlfriend because  she wanted to get pregnant and he is not ready to settle down yet.  He  does have an extended family in town.   PAST MEDICAL HISTORY:  Mr. Kearley does have episodes documented in the  past medical record several times of nausea and vomiting.  Gastroenterology has assessed him currently, and does believe that this  abdominal syndrome is not secondary to a  primary gastrointestinal  problem.   MEDICATIONS:  MAR is reviewed.  He is on Ativan 0.5 to 1 mg q.6 hours  p.r.n.   He does have an allergy to Remeron.   LABORATORY DATA:  He has not had a thyroid check.  His SGOT is 15, SGPT  12.   REVIEW OF SYSTEMS:  Noncontributory.   PHYSICAL EXAMINATION:  VITAL SIGNS:  Temperature is 98.7, pulse 78,  respiratory rate 18, blood pressure 132/69, O2 saturation on room air  99%.  GENERAL APPEARANCE:  Mr. Bunton is a young male sitting up in his  hospital bed with no abnormal involuntary movements.   MENTAL STATUS EXAM:  Mr. Ra eye contact is good.  He is socially  appropriate and cooperative.  His affect is slightly anxious, mood is  slightly anxious.  He is oriented to all spheres.  Memory function is  intact to immediate recent and remote.  Fund of knowledge and  intelligence normal, speech normal.  Thought process logical, coherent,  goal-directed.  No looseness of associations.  Thought content:  No  thoughts of harming  himself or others, no hallucinations.  No delusions.  Insight is intact, judgment is intact.   ASSESSMENT:  Axis I:  293.84 anxiety disorder not otherwise specified,  rule out generalized anxiety disorder.  He does state that he has been  considered to have generalized anxiety disorder in the past.  Marijuana  abuse.  Axis II:  Deferred.  Axis III:  See past medical history.  Axis IV:  General medical.  Axis V:  55.   Mr. Bolls is not at risk to harm himself or others.  He does agree to  call emergency services immediately for any thoughts of harming himself,  thoughts of harming others or distress.   He will not drive if drowsy.   The undersigned provided ego supportive psychotherapy and education.   The indications, alternatives, and adverse effects of venlafaxine were  discussed with the patient for anti-anxiety including the risk of  raising his blood pressure.  He understands and would like to proceed as  below.   Effexor will also provide depression prevention.   The undersigned also emphasized cognitive behavioral therapy with deep  breathing and progressive muscle relaxation.   The undersigned recommended chemical dependence rehabilitation for his  marijuana use.  However, he declined.   RECOMMENDATIONS:  1. Would start venlafaxine at 37.5 mg p.o. q.a.m. in generic      nonextended release form.  Would then increase by 37.5 mg every 4      days while checking blood pressure to the initial trial dose of 150      mg in the form of 75 mg p.o. b.i.d..  2. Would ask the social worker to set Mr. Mazzocco up with outpatient      psychiatric follow-up to include a therapist performing cognitive      behavioral therapy with deep breathing and progressive muscle      relaxation.   He may have some counseling available at his school.  However,  psychiatric clinics are available at [facility], [facility], [facility].  [facility] is also the [facility]  [facility].      Antonietta Breach, M.D.  Electronically Signed     JW/MEDQ  D:  06/30/2008  T:  06/30/2008  Job:  [id number]   cc:   Princella Pellegrini Team

## 2010-10-15 NOTE — Discharge Summary (Signed)
Preston Munoz, Preston Munoz         ACCOUNT NO.:  [id number]   MEDICAL RECORD NO.:  [id number]          PATIENT TYPE:  INP   LOCATION:  5024                         FACILITY:  [facility]   PHYSICIAN:  Preston Munoz, M.D.  DATE OF BIRTH:  1981/01/21   DATE OF ADMISSION:  08/09/2008  DATE OF DISCHARGE:  08/10/2008                               DISCHARGE SUMMARY   DISCHARGE DIAGNOSES:  1. Recurrent nausea and vomiting due to anxiety disorder.  2. Esophagitis.  3. Gastroesophageal reflux disease.  4. Gastritis.  5. Anxiety/depression.  6. Hypokalemia.  7. History of marijuana use.   DISCHARGE MEDICATIONS:  1. Prevacid 50 mg 1 tablet by mouth daily.  2. The patient will receive Vicodin 5/325 mg 15 tablets in order to      help controlling his abdominal pain 1 tablet every 6 hours p.r.n.  3. Multivitamin 1 tablet by mouth daily.  4. Lexapro 10 mg by mouth daily.  5. Phenergan 25 mg by mouth every 6 hours p.r.n. for pain.   DISPOSITION AND FOLLOWUP:  The patient is discharged in stable condition  and able to tolerate p.o. medications and also p.o. ingestion of foods.  The patient is going to be followed on September 13, 2008, at 8:45 a.m. by  Dr. Reche Dixon at [facility].  At that visit, a BMET should be checked in  order to evaluate electrolytes, specifically potassium which was  abnormal during this admission and also to evaluate the patient's renal  function.  He will need adjustment of his medications, specifically the  one for his anxiety and depression.  The patient had been advised to  return to mental health for further evaluation and treatment of his  psychiatric disorders.  The patient was agreeable to the idea.   PROCEDURES PERFORMED:  The patient had during this admission x-ray of  his abdomen and was completely negative demonstrating just mild  nonobstructed ileus.   RADIOLOGIC FINDINGS:  Negative for free air or other abnormalities.   CONSULTATIONS:  No consultations were  made.   BRIEF HISTORY OF PRESENT ILLNESS:  30 year old male with past medical  history of anxiety/depression, conversion disorder, mild esophagitis by  endoscopy in 2005 adn 2010, and also multiple visits to the emergency  department and 2 previous admissions secondary to nausea and vomiting,  who came complaining of nausea, vomiting, and abdominal pain for about a  week prior to admission.  The patient reports that the pain had been  constant and worsening up to the point that he had been unable to keep  anything down.  The patient denies fever, chills, diarrhea, insidious  blood in his vomiting, chest pain, shortness of breath, or any other  complaints.  Of note, the patient had history of Helicobacter pylori  infection in 2005, and he was treated at that time. The patient denies  weight loss, recent traveling, or sick contacts.  He endorses decreased  appetite.   VITAL SIGNS:  Temperature 98.5, blood pressure 162/100 on admission,  heart rate 85, respiratory rate 16, oxygen saturation 98% on room air.   At admission, the patient's labs demonstrated a lipase  of 22, sodium  138, potassium 3.2, chloride 104, bicarb 23, BUN 12, creatinine 0.92,  blood sugar 116.  White blood cells 7.4, hemoglobin 14.5, platelet 264.  The patient had an AST of 21, bilirubin 1.0, alkaline phosphatase 48,  ALT 15, total protein 6.8, albumin blood 4.3, calcium 9.0.  During this  admission, the patient had an UDS which was positive for  benzodiazepines, opiates, and also tetrahydrocannabinol.  PT 13.5, INR  1.0, PTT 25, magnesium 2.0.   HOSPITAL COURSE:  1. Abdominal pain, nausea, and vomiting, all secondary to underlying      anxiety disorder.  The patient had been discharged on Prevacid 50      mg 1 tablet by mouth daily in order to control his gastroesophageal      reflux and had been advised to follow with mental health for      further adjustment of his medications and further evaluation of his       psychiatric disorder.  The patient was agreeable with treatment and      followup requirements and is going to take care of that issue as an      outpatient.  The patient will be also followed by Dr. Reche Dixon at      [facility] in order to establish a primary care physician      relationship and to follow his electrolytes and medications.  2. Hypokalemia secondary to vomiting and dehydration.  During this      admission, IV fluids and electrolyte repletions were provided.      Magnesium level was checked with normal range of 2.0 and at      discharge, the patient's potassium was back into normal range.  3. Nonobstructive ileus, most likely secondary to hypokalemia.  As      soon as the electrolytes were normalized, the patient was able to      have a bowel movement and was able to tolerate p.o. as well.  4. Anxiety and depression.  The patient has been seen at the [facility] and was on Lexapro and Amitryptiline. He will follow      up at [facility] for further adjustment of medication and      evaluation of his psychiatry issues.  5. Gastroesophageal reflux disease.  The patient was started on      Prevacid 15 mg by mouth daily.  6. Marijuana abuse.  The patient received counseling by Social Work in      the hospital and had been instructed and provided with appropriate      information for outpatient counseling.  He is agreeable to try to      quit and had been advised and informed of nausea and vomiting as a      side effect of marijuana use.   VITAL SIGNS AND LABS AT THE MOMENT OF DISCHARGE:  Temperature 99.4,  blood pressure 115/53, heart rate 74, respiratory rate 20, oxygen  saturation 98% on room air.  Labs, white blood cells 7.6, hemoglobin  12.6, platelets 234.  Sodium 136, potassium 3.8, chloride 106, bicarb  24, BUN 9, creatinine 0.9, blood sugar 79, magnesium level was 2.0.      Preston Randy, MD  Electronically Signed      Preston Munoz, M.D.  Electronically Signed   CEM/MEDQ  D:  08/10/2008  T:  08/11/2008  Job:  [id number]   cc:   Dineen Kid. Reche Dixon, M.D.

## 2010-10-15 NOTE — Discharge Summary (Signed)
NAME:  Preston Munoz, Preston Munoz         ACCOUNT NO.:  [id number]   MEDICAL RECORD NO.:  [id number]          PATIENT TYPE:  INP   LOCATION:  1524                         FACILITY:  [facility]   PHYSICIAN:  Charlestine Massed, MDDATE OF BIRTH:  01-09-1981   DATE OF ADMISSION:  06/26/2008  DATE OF DISCHARGE:                               DISCHARGE SUMMARY   REASON FOR ADMISSION:  Abdominal pain and recurrent nausea and vomiting.   HOSPITAL COURSE:  A 30 year old gentleman, Preston Munoz with  prior history of gastritis with depression and anxiety and has a prior  history of marijuana usage with no alcohol or tobacco usage came to the  hospital because of abdominal pain for a few days with occasional nausea  with vomiting.  The patient was admitted with a provisional diagnosis of  gastroesophageal reflux disease and the patient was seen by  gastroenterologist during this admission.  Esophagoduodenostomy was done  which revealed minimal grade 1 esophagitis and as per gastroenterology  grade 1 esophagitis was not considerable enough to cause the symptoms.  They tried a gastric emptying scan.  The patient was not able to hold  the dye which was given, and so the patient desperate to repeat trials.  At that time, gastroenterology considered the possibility of functional  nausea with vomiting, and so a psych evaluation was called for.   So he was seen by Dr. Antonietta Breach, psychiatry and in his opinion the  diagnosis has been considered anxiety disorder, not otherwise specified,  to rule out generalized anxiety disorder.  The patient also has  marijuana abuse.  The patient has been educated with regards to his  anxiety issues and he was not found at any moment to be a risk to harm  himself or suicidal.  He has been started on Effexor with venlafaxine,  and the patient has been educated with regards to the side effects of  venlafaxine and the patient is also advised to follow up with  psychiatry  as outpatient for increasing the dose of venlafaxine to the maximum dose  of 150 mg.  Currently, the patient is on 37.5 mg of Effexor.  The  patient has also been advised to follow up with a psychotherapist for  cognitive behavioral therapy.   Social worker was advised yesterday to help patient in setting up  appointments with his psychotherapy as well as  to see a psychiatrist.  All those appointments have been set by social workers.   The patient also had a low potassium during this admission.  Despite  multiple repletions, the patient's potassium was staying low.  The  patient was stopped off IV fluids as he was tolerating p.o. feeds very  well since yesterday morning.  He did not have any further episodes of  nausea or vomiting.  He is taking solid foods.  Potassium was repleted  today.  Today, the current potassium is 3.6.  The patient is being  discharged back home on p.o. potassium for the next week and also to  follow up with psychiatrist as outpatient.  The patient has also been  advised to set up with a primary care  doctor, follow up with his primary  care doctor.  As he does not have a primary care doctor, he has been  advised to follow up with psychiatry for now and get primary care set up  at their clinic.   DISCHARGE DIAGNOSES:  1. Mild esophagitis.  2. Anxiety disorder, not otherwise specified.  3. Marijuana use.   DISCHARGE MEDICATIONS:  1. Effexor 37.5 mg p.o. daily.  2. Prilosec 20 mg p.o. daily.  3. Reglan 5 mg p.o. b.i.d. p.r.n. for nausea and vomiting.  4. Potassium chloride 10 mEq p.o. daily for 1-week.   DISPOSITION:  The patient is discharged back home.   FOLLOWUP:  1. To follow up with psychiatry and psychotherapist as outpatient.  2. To get primary care set up by calling [facility] or to consult      with psychiatrist in regards to setting up a primary care      appointment as outpatient.  The patient has been given the phone      number  for [facility] to call and make appointment for primary      care.   A total of 45 minutes has been spent on this discharge.      Charlestine Massed, MD  Electronically Signed     UT/MEDQ  D:  07/01/2008  T:  07/01/2008  Job:  [id number]   cc:   Antonietta Breach, M.D.

## 2010-10-18 NOTE — Discharge Summary (Signed)
NAME:  Preston Munoz, Preston Munoz         ACCOUNT NO.:  [id number]   MEDICAL RECORD NO.:  [id number]          PATIENT TYPE:  INP   LOCATION:  5740                         FACILITY:  [facility]   PHYSICIAN:  Deirdre Peer. Polite, M.D. DATE OF BIRTH:  1981-05-01   DATE OF ADMISSION:  05/01/2004  DATE OF DISCHARGE:  05/04/2004                                 DISCHARGE SUMMARY   DISCHARGE DIAGNOSES:  1.  Focal gastritis with mass effect, also ulcerated esophagitis.  Please      note that pathology is pending at the time of dictation as well as a      Campylobacter-like organism (CLO) test.  Patient preferred the      outpatient followup with Gastroenterology.  Will need b.i.d. proton pump      inhibitor as well as Sucralfate.  2.  History of depression and anxiety.  3.  Marijuana use.  4.  Nausea and vomiting secondary to #1 resolved, tolerating p.o. intake at      time of discharge.  5.  Mild hypokalemia resolved at the time of discharge.   DISCHARGE MEDICATIONS:  1.  Protonix 40 mg one every 12 hours.  2.  Sucralfate 1 gm q.i.d.  3.  Patient is asked to avoid all NSAID products.   FOLLOWUP:  Patient will have followup appointment with Dr. Randa Evens and will  be provided with number to call for outpatient followup as well as followup  pathology, [phone], has been provided to the patient.   STUDIES:  Pathology from biopsies pending at the time of this dictation.  Patient underwent an EGD, which showed a focal area of gastritis with mass  effect and ulcerated esophagitis.  BMET at the time of discharge  within  normal limits.  On admission, significant for mild hypokalemia at 3.2, CBC  within normal limits, amylase and lipase within normal limits, urine drug  screen positive for marijuana and barbiturates.  CT scan of the abdomen and  pelvis within normal limits.   HISTORY OF PRESENT ILLNESS:  A 30 year old male with above past medical  history of depression and substance abuse, i.e., marijuana,  presented to the  ED on several occasions with the complaints of nausea and vomiting.  Patient  was admitted on the 30th because of persistent symptoms and dehydration.  Labs in the ED were essentially unremarkable as well as a CAT scan.  Admission was deemed necessary for further evaluation of nausea, vomiting,  and abdominal pain to rule out pathology.  Please see dictated H&P for  further details.   PAST MEDICAL HISTORY:  As stated above.   MEDICATIONS ON ADMISSION:  Phenergan suppositories, Mylanta, Paxil 30 mg  daily.   SOCIAL HISTORY:  Social alcohol.  Negative for tobacco.  Positive for  marijuana.   FAMILY HISTORY:  Noncontributory.   HOSPITAL COURSE:  The patient was admitted to the medicine floor for  evaluation and treatment of nausea, vomiting, and abdominal pain.  As  stated, the patient's labs in the ED were unremarkable as well as CT of the  abdomen was unremarkable.  However, the patient's symptoms of discomfort  were worrisome, and  concern to rule out gastritis versus also was  entertained.  A GI consult was obtained.  Dr. Randa Evens saw the patient.  The  patient underwent endoscopy on the 2nd.  EGD showed an inflammatory mass in  the fundus of the stomach.  Differential diagnosis was focal gastritis, GIST  tumor, leiomyoma, lymphoma, etc.  It was elected to treat the patient  symptomatically while awaiting pathology.  Patient was maintained NPO until  the following morning when he was started on p.o. intake, which he tolerated  well without any nausea, vomiting, or abdominal discomfort.  At this time,  patient is stable for discharge.  Patient will continue with a b.i.d. PPI as  well as Sucralfate.  Patient has been asked to avoid all NSAIDs and will be  following up with GI on an outpatient basis for further management.  At this  time, patient is stable for discharge.      Joen Laura   RDP/MEDQ  D:  05/04/2004  T:  05/05/2004  Job:  [id number]   cc:   Fayrene Fearing L. Malon Kindle., M.D.  [address]  [place]  [place] [address]  Fax: [phone]

## 2010-10-18 NOTE — Consult Note (Signed)
NAME:  Preston Munoz, Preston Munoz         ACCOUNT NO.:  [id number]   MEDICAL RECORD NO.:  [id number]          PATIENT TYPE:  INP   LOCATION:  5740                         FACILITY:  [facility]   PHYSICIAN:  James L. Malon Kindle., M.D.DATE OF BIRTH:  09-Oct-1980   DATE OF CONSULTATION:  05/02/2004  DATE OF DISCHARGE:                                   CONSULTATION   GASTROENTEROLOGY CONSULTATION:   REFERRING PHYSICIAN:  Dr. Deirdre Peer. Polite.   REASON FOR CONSULTATION:  Persistent nausea, vomiting and upper abdominal  pain.   HISTORY:  The patient is a 30 year old white male, a patient of Dr. Onalee Hua B.  Massey's, who has been daily to the emergency room for the past 4 days.  He  has had 4 days of recurrent nausea and vomiting without any abdominal  distention, fever, etc.  His vomitus has been whatever it was that he ate or  drank; he has not seen any blood.  He was reported to have some loose  stools, but he tells me now that he really has not had much in the way of  loose stools.  He has had a CT scan of the abdomen and pelvis which was  normal and all of his lab work including urinalysis, etc, were normal.  The  patient notes that he has really felt sick and had epigastric pain and  heartburn for 2 or 3 weeks and has lost approximately 15 pounds.  The  patient has been unable to eat or drink and for this reason, we are asked to  see him.  He states that he had similar symptoms back in [place]; he was  incarcerated there for several months earlier this year and was evaluated in  the jail, was given Prilosec and Bentyl; it took about 2 weeks and he  gradually got better; he was unable to eat or drink anything at that time.  He notes no abdominal distention.  He is having increased heartburn and  indigestion.   CURRENT MEDICATIONS:  Paxil, Mylanta, Phenergan suppositories.  He is now on  IV Protonix and Reglan.   PAST MEDICAL HISTORY:  Depression and anxiety.   PAST SURGICAL HISTORY:  He has  never had any surgeries.   FAMILY HISTORY:  Family history noncontributory.  There is no family history  of ulcers, Crohn's disease.   SOCIAL HISTORY:  He does not drink or smoke.  He is unemployed, staying with  his parents, does smoke occasional marijuana.   PHYSICAL EXAM:  VITAL SIGNS:  The patient is afebrile and vital signs are  normal.  GENERAL:  An alert white male in no acute distress.  EYES:  Sclerae nonicteric.  NECK:  Neck is supple.  No lymphadenopathy.  LUNGS:  Lungs clear.  HEART:  Regular rate and rhythm without murmurs or gallops.  ABDOMEN:  Abdomen is soft and non-distended, nontender, except mild  tenderness in the epigastrium.   ASSESSMENT:  Recurrent nausea, vomiting and weight loss.  It does not appear  to be a bowel obstruction, based on his clinical course and the negative CT.  With the increased heartburn and epigastric  tenderness, I think we need to  rule out an ulcer.   PLAN:  We will go ahead with an endoscopy in the morning; I have discussed  this with him and he is agreeable.       JLE/MEDQ  D:  05/02/2004  T:  05/02/2004  Job:  [id number]   cc:   Oley Balm. Georgina Pillion, M.D.  [address]  [place]  [place] [address]  Fax: [phone]

## 2010-10-18 NOTE — H&P (Signed)
NAME:  LUCY, WOOLEVER         ACCOUNT NO.:  [id number]   MEDICAL RECORD NO.:  [id number]          PATIENT TYPE:  EMS   LOCATION:  MINO                         FACILITY:  [facility]   PHYSICIAN:  Corinna L. Lendell Caprice, MDDATE OF BIRTH:  Jun 23, 1980   DATE OF ADMISSION:  05/01/2004  DATE OF DISCHARGE:                                HISTORY & PHYSICAL   CHIEF COMPLAINT:  Vomiting and pain.   HISTORY OF PRESENT ILLNESS:  Mr. Kowal is a 30 year old white male patient  of Dr. Georgina Pillion who has been to the emergency room every day for 4 days with  vomiting.  He started having some watery diarrhea today.  He has been having  some subjective fevers, chills, and sweats.  He had a CT scan of the abdomen  and pelvis yesterday, which was normal.  All of his blood work has been  normal, as was his UA.  Today, he felt so weak and so sick when he would  more, that he called 911, and arrived via EMS.  He reports that he has  chronic recurrent abdominal cramping, and was placed on Prilosec in the  past.  This did not help, so he stopped it.  He has had no hematemesis, no  melena or hematochezia.  Phenergan suppositories have not helped.  He denies  NSAID use or alcohol.  He reports that he smokes marijuana occasionally, but  denies any other drug use.  He has been unable to eat or drink anything over  the past 4 days.   PAST MEDICAL HISTORY:  Depression anxiety.   MEDICATIONS:  1.  Phenergan suppositories.  2.  Mylanta.  3.  Paxil 30 mg p.o. daily.   SOCIAL HISTORY:  The patient denies drinking.  Denies smoking.  He is  unemployed currently, and is staying with his parents.  He smokes marijuana  occasionally.   FAMILY HISTORY:  Noncontributory.   REVIEW OF SYSTEMS:  As above; otherwise, negative.   PHYSICAL EXAMINATION:  VITAL SIGNS:  His initial temperature was 98.7, but  increased to 99.9.  Blood pressure on admission was 168/95, currently  154/48, pulse 72, respiratory rate 16, oxygen  saturation 100% on room air.  GENERAL:  The patient is a comfortable-appearing white male.  HEENT:  Normocephalic and atraumatic.  Pupils equal, round and reactive to  light.  He has dry mucous membranes.  NECK:  Supple.  No lymphadenopathy.  LUNGS:  Clear to auscultation bilaterally without wheezes, rhonchi, or  rales.  CARDIOVASCULAR:  Regular rate and rhythm without murmurs, gallops, or rubs.  ABDOMEN:  Normal bowel sounds, soft, nontender, nondistended.  GU/RECTAL:  Deferred.  EXTREMITIES:  No clubbing, cyanosis, or edema.  SKIN:  He has a fine lacey erythematous rash over his chest and neck, which  is nonpalpable.  NEUROLOGIC:  Alert and oriented.  Cranial nerves and sensorimotor exam are  intact.  PSYCHIATRIC:  The patient appears somewhat anxious.   LABORATORY DATA:  CBC today is normal.  BMET yesterday was normal.  Urine  drug screen a few days ago was positive for THC and barbiturates.  CT scan  of the abdomen  and pelvis yesterday was negative.   ASSESSMENT AND PLAN:  1.  Viral gastroenteritis.  As the patient is weak, unable to eat or drink,      and has been to the emergency room every day for 4 days, I will admit      him to the hospital for IV fluids, anti-emetics, proton pump inhibitor,      and diet as tolerated.  2.  Depression.  Hold his Paxil for now.      Cori   CLS/MEDQ  D:  05/01/2004  T:  05/01/2004  Job:  [id number]   cc:   Oley Balm. Georgina Pillion, M.D.  [address]  [place]  [place] [address]  Fax: [phone]

## 2010-10-18 NOTE — Op Note (Signed)
NAME:  Preston Munoz, Preston Munoz         ACCOUNT NO.:  [id number]   MEDICAL RECORD NO.:  [id number]          PATIENT TYPE:  INP   LOCATION:  5740                         FACILITY:  [facility]   PHYSICIAN:  James L. Malon Kindle., M.D.DATE OF BIRTH:  [birth year]   DATE OF PROCEDURE:  05/03/2004  DATE OF DISCHARGE:                                 OPERATIVE REPORT   PROCEDURE:  Esophagogastroduodenoscopy and biopsy.   MEDICATIONS GIVEN:  Fentanyl 100 mcg, Versed 10 mg IV.   INDICATIONS FOR PROCEDURE:  Patient with persistent nausea, vomiting, and  epigastric pain that has been refractory to medications.  She has had an  extensive workup that has failed to reveal a cause.  This is done in an  attempt to evaluate for a cause.   DESCRIPTION OF PROCEDURE:  The procedure was explained to the patient and  consent obtained.  With the patient in the left lateral decubitus position,  the Olympus scope was inserted and advanced.  The stomach was entered, the  pylorus identified and passed, duodenum including the bulb and second  duodenum were seen well.  There were several duodenal erosions.  The pyloric  channel was normal.  The antrum and body were seen well and were grossly  normal other than some slight erythema.  A biopsy was taken for test for  Helicobacter.  On the retroflex view high up in the fundus was what appeared  to be an inflammatory mass.  This was quite localized to the area of the  fundus just along the greater curve, it was biopsied and appeared to be  quite friable.  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  The proximal esophagus was  endoscopically normal.   ASSESSMENT:  1.  Inflammatory mass in the fundus, probably focal gastritis.  2.  Ulcerative esophagitis.   PLAN:  Will double the PPI, add Carafate, will check the results of a  CLOtest and path, and go ahead and advance diet.       JLE/MEDQ  D:  05/03/2004  T:   05/03/2004  Job:  [id number]   cc:   Oley Balm. Georgina Pillion, M.D.  [address]  [place]  [place] [address]  Fax: [phone]

## 2010-12-05 ENCOUNTER — Inpatient Hospital Stay (INDEPENDENT_AMBULATORY_CARE_PROVIDER_SITE_OTHER)
Admission: RE | Admit: 2010-12-05 | Discharge: 2010-12-05 | Disposition: A | Discharge status: Home / Self Care | Payer: BC Managed Care – PPO | Source: Ambulatory Visit | Attending: Family Medicine | Admitting: Family Medicine

## 2010-12-05 DIAGNOSIS — L989 Disorder of the skin and subcutaneous tissue, unspecified: Secondary | ICD-10-CM

## 2012-08-13 ENCOUNTER — Emergency Department (HOSPITAL_COMMUNITY)
Admission: EM | Admit: 2012-08-13 | Discharge: 2012-08-13 | Disposition: A | Discharge status: Home / Self Care | Payer: Self-pay

## 2012-11-17 ENCOUNTER — Encounter (HOSPITAL_COMMUNITY): Payer: Self-pay

## 2012-11-17 ENCOUNTER — Emergency Department (HOSPITAL_COMMUNITY)
Admission: EM | Admit: 2012-11-17 | Discharge: 2012-11-17 | Disposition: A | Discharge status: Home / Self Care | Payer: Self-pay | Attending: Emergency Medicine | Admitting: Emergency Medicine

## 2012-11-17 DIAGNOSIS — L259 Unspecified contact dermatitis, unspecified cause: Secondary | ICD-10-CM | POA: Insufficient documentation

## 2012-11-17 DIAGNOSIS — F17200 Nicotine dependence, unspecified, uncomplicated: Secondary | ICD-10-CM | POA: Insufficient documentation

## 2012-11-17 MED ORDER — HYDROXYZINE HCL 25 MG PO TABS: 25.0000 mg | ORAL_TABLET | Freq: Four times a day (QID) | ORAL | Status: DC | PRN

## 2012-11-17 MED ORDER — PREDNISONE 10 MG PO TABS: ORAL_TABLET | ORAL | Status: DC

## 2012-11-17 MED ORDER — PREDNISONE 50 MG PO TABS
60.0000 mg | ORAL_TABLET | Freq: Once | ORAL | Status: AC
  Administered 2012-11-17: 60 mg via ORAL
  Filled 2012-11-17: qty 1

## 2012-11-17 NOTE — ED Notes (Signed)
Pt c/o generalized rash for past few days.  C/O severe itching.

## 2012-11-17 NOTE — ED Notes (Signed)
Pt states he thinks he may have shingles. States he broke out in a rash three days ago

## 2012-11-17 NOTE — ED Provider Notes (Signed)
History     CSN: [id number]  Arrival date & time 11/17/12  0709   First MD Initiated Contact with Patient 11/17/12 [phone]      Chief Complaint  Patient presents with  . Rash     HPI Pt was seen at 0725.  Per pt, c/o gradual onset and persistence of constant "itching rash" that began 3 days ago. Cannot recall exposure to new soaps/detergents, outdoor poison plants, etc.  Denies SOB, no wheezing/stridor, no intra-oral edema.     History reviewed. No pertinent past medical history.  History reviewed. No pertinent past surgical history.    History  Substance Use Topics  . Smoking status: Current Every Day Smoker  . Smokeless tobacco: Not on file  . Alcohol Use: No      Review of Systems ROS: Statement: All systems negative except as marked or noted in the HPI; Constitutional: Negative for fever and chills. ; ; Eyes: Negative for eye pain, redness and discharge. ; ; ENMT: Negative for ear pain, hoarseness, nasal congestion, sinus pressure and sore throat. ; ; Cardiovascular: Negative for chest pain, palpitations, diaphoresis, dyspnea and peripheral edema. ; ; Respiratory: Negative for cough, wheezing and stridor. ; ; Gastrointestinal: Negative for nausea, vomiting, diarrhea, abdominal pain, blood in stool, hematemesis, jaundice and rectal bleeding. . ; ; Genitourinary: Negative for dysuria, flank pain and hematuria. ; ; Musculoskeletal: Negative for back pain and neck pain. Negative for swelling and trauma.; ; Skin: +itching rash. Negative for abrasions, blisters, bruising and skin lesion.; ; Neuro: Negative for headache, lightheadedness and neck stiffness. Negative for weakness, altered level of consciousness , altered mental status, extremity weakness, paresthesias, involuntary movement, seizure and syncope.        Allergies  Review of patient's allergies indicates no known allergies.  Home Medications   Current Outpatient Rx  Name  Route  Sig  Dispense  Refill  .  hydrOXYzine (ATARAX/VISTARIL) 25 MG tablet   Oral   Take 1 tablet (25 mg total) by mouth every 6 (six) hours as needed for itching.   15 tablet   0   . predniSONE (DELTASONE) 10 MG tablet      Take 5 tablets PO x2 days, then take 4 tabs PO x3 days, then 3 tabs PO x3 days, then 2 tabs PO x3 days, then 1 tab PO x3 days. Start 11/18/2012   40 tablet   0     BP 154/107  Pulse 93  Temp(Src) 98.2 F (36.8 C) (Oral)  Resp 18  Ht 5\' 7"  (1.702 m)  Wt 150 lb (68.04 kg)  BMI 23.49 kg/m2  SpO2 98%  Physical Exam 0730: Physical examination:  Nursing notes reviewed; Vital signs and O2 SAT reviewed;  Constitutional: Well developed, Well nourished, Well hydrated, In no acute distress; Head:  Normocephalic, atraumatic; Eyes: EOMI, PERRL, No scleral icterus; ENMT: Mouth and pharynx normal, Mucous membranes moist. No intra-oral edema. No hoarse voice, no drooling, no stridor.; Neck: Supple, Full range of motion, No lymphadenopathy; Cardiovascular: Regular rate and rhythm, No murmur, rub, or gallop; Respiratory: Breath sounds clear & equal bilaterally, No rales, rhonchi, wheezes.  Speaking full sentences with ease, Normal respiratory effort/excursion; Chest: Nontender, Movement normal; Extremities: Pulses normal, No tenderness, No edema, No calf edema or asymmetry.; Neuro: AA&Ox3, Major CN grossly intact.  Speech clear. No gross focal motor or sensory deficits in extremities.; Skin: Color normal, Warm, Dry, +diffuse maculopapular rash with excoriations to bilat LE's, UE's and torso that pt is scratching at  during my exam. No hives, no petechiae.    ED Course  Procedures     MDM  MDM Reviewed: previous chart, nursing note and vitals     0730:  Diffuse maculopapular pruritic rash, appears contact dermatitis. Will tx symptomatically at this time.  Pt concerned that he "got the shingles from my grandmother;" pt reassured this does not appear to be shingles rash.  Dx d/w pt.  Questions answered.  Verb  understanding, agreeable to d/c home with outpt f/u.        Laray Anger, DO 11/17/12 2158

## 2012-11-22 ENCOUNTER — Encounter (HOSPITAL_COMMUNITY): Payer: Self-pay | Admitting: *Deleted

## 2012-11-22 ENCOUNTER — Emergency Department (HOSPITAL_COMMUNITY)
Admission: EM | Admit: 2012-11-22 | Discharge: 2012-11-22 | Disposition: A | Discharge status: Home / Self Care | Payer: Self-pay | Attending: Emergency Medicine | Admitting: Emergency Medicine

## 2012-11-22 DIAGNOSIS — F17200 Nicotine dependence, unspecified, uncomplicated: Secondary | ICD-10-CM | POA: Insufficient documentation

## 2012-11-22 DIAGNOSIS — Z79899 Other long term (current) drug therapy: Secondary | ICD-10-CM | POA: Insufficient documentation

## 2012-11-22 DIAGNOSIS — L0291 Cutaneous abscess, unspecified: Secondary | ICD-10-CM

## 2012-11-22 DIAGNOSIS — L03319 Cellulitis of trunk, unspecified: Secondary | ICD-10-CM | POA: Insufficient documentation

## 2012-11-22 DIAGNOSIS — F988 Other specified behavioral and emotional disorders with onset usually occurring in childhood and adolescence: Secondary | ICD-10-CM | POA: Insufficient documentation

## 2012-11-22 DIAGNOSIS — F411 Generalized anxiety disorder: Secondary | ICD-10-CM | POA: Insufficient documentation

## 2012-11-22 DIAGNOSIS — R450 Nervousness: Secondary | ICD-10-CM | POA: Insufficient documentation

## 2012-11-22 DIAGNOSIS — L02219 Cutaneous abscess of trunk, unspecified: Secondary | ICD-10-CM | POA: Insufficient documentation

## 2012-11-22 DIAGNOSIS — L03317 Cellulitis of buttock: Secondary | ICD-10-CM | POA: Insufficient documentation

## 2012-11-22 DIAGNOSIS — L0231 Cutaneous abscess of buttock: Secondary | ICD-10-CM | POA: Insufficient documentation

## 2012-11-22 DIAGNOSIS — I10 Essential (primary) hypertension: Secondary | ICD-10-CM | POA: Insufficient documentation

## 2012-11-22 HISTORY — DX: Anxiety disorder, unspecified: F41.9

## 2012-11-22 HISTORY — DX: Essential (primary) hypertension: I10

## 2012-11-22 HISTORY — DX: Other specified behavioral and emotional disorders with onset usually occurring in childhood and adolescence: F98.8

## 2012-11-22 MED ORDER — AMOXICILLIN-POT CLAVULANATE 875-125 MG PO TABS
1.0000 | ORAL_TABLET | Freq: Once | ORAL | Status: AC
  Administered 2012-11-22: 1 via ORAL
  Filled 2012-11-22: qty 1

## 2012-11-22 MED ORDER — SULFAMETHOXAZOLE-TMP DS 800-160 MG PO TABS
1.0000 | ORAL_TABLET | Freq: Once | ORAL | Status: AC
  Administered 2012-11-22: 1 via ORAL
  Filled 2012-11-22: qty 1

## 2012-11-22 MED ORDER — AMOXICILLIN 500 MG PO CAPS: 500.0000 mg | ORAL_CAPSULE | Freq: Three times a day (TID) | ORAL | Status: DC

## 2012-11-22 MED ORDER — SULFAMETHOXAZOLE-TRIMETHOPRIM 800-160 MG PO TABS: 1.0000 | ORAL_TABLET | Freq: Two times a day (BID) | ORAL | Status: DC

## 2012-11-22 MED ORDER — HYDROCODONE-ACETAMINOPHEN 7.5-325 MG PO TABS: 1.0000 | ORAL_TABLET | ORAL | Status: DC | PRN

## 2012-11-22 MED ORDER — HYDROCODONE-ACETAMINOPHEN 5-325 MG PO TABS
2.0000 | ORAL_TABLET | Freq: Once | ORAL | Status: AC
  Administered 2012-11-22: 2 via ORAL
  Filled 2012-11-22: qty 2

## 2012-11-22 MED ORDER — ONDANSETRON HCL 4 MG PO TABS
4.0000 mg | ORAL_TABLET | Freq: Once | ORAL | Status: AC
  Administered 2012-11-22: 4 mg via ORAL
  Filled 2012-11-22: qty 1

## 2012-11-22 NOTE — ED Notes (Signed)
Pt given d/c instructions verbalized understanding of all, scripts given,  Declined offer for work note.

## 2012-11-22 NOTE — ED Notes (Signed)
Pt placed 2 band-aids on wound himself. Girlfriend, witnessed taking gloves from box in exam room, stated "i'm gonna need these at home".

## 2012-11-22 NOTE — ED Provider Notes (Signed)
History     CSN: [id number]  Arrival date & time 11/22/12  1123   First MD Initiated Contact with Patient 11/22/12 1222      Chief Complaint  Patient presents with  . Abscess    (Consider location/radiation/quality/duration/timing/severity/associated sxs/prior treatment) Patient is a 32 y.o. male presenting with abscess. The history is provided by the patient.  Abscess Location:  Torso and ano-genital Torso abscess location: periumbilical. Ano-genital abscess location:  R buttock Abscess quality: not draining   Red streaking: no   Duration:  5 days Progression:  Worsening Chronicity:  New Context: not diabetes and not immunosuppression   Relieved by:  Nothing Worsened by:  Draining/squeezing Ineffective treatments:  None tried Associated symptoms: no fever and no nausea   Risk factors: no hx of MRSA     Past Medical History  Diagnosis Date  . Hypertension   . ADD (attention deficit disorder)   . Anxiety     Past Surgical History  Procedure Laterality Date  . Fracture nose surgery      History reviewed. No pertinent family history.  History  Substance Use Topics  . Smoking status: Current Every Day Smoker  . Smokeless tobacco: Not on file  . Alcohol Use: No      Review of Systems  Constitutional: Negative for fever and activity change.       All ROS Neg except as noted in HPI  HENT: Negative for nosebleeds and neck pain.   Eyes: Negative for photophobia and discharge.  Respiratory: Negative for cough, shortness of breath and wheezing.   Cardiovascular: Negative for chest pain and palpitations.  Gastrointestinal: Negative for nausea, abdominal pain and blood in stool.  Genitourinary: Negative for dysuria, frequency and hematuria.  Musculoskeletal: Negative for back pain and arthralgias.  Skin: Positive for wound.  Neurological: Negative for dizziness, seizures and speech difficulty.  Psychiatric/Behavioral: Negative for hallucinations and confusion.  The patient is nervous/anxious.     Allergies  Review of patient's allergies indicates no known allergies.  Home Medications   Current Outpatient Rx  Name  Route  Sig  Dispense  Refill  . alprazolam (XANAX) 2 MG tablet   Oral   Take 1 mg by mouth 4 (four) times daily.         Marland Kitchen amphetamine-dextroamphetamine (ADDERALL) 30 MG tablet   Oral   Take 30 mg by mouth 2 (two) times daily.         . predniSONE (DELTASONE) 10 MG tablet      Take 5 tablets PO x2 days, then take 4 tabs PO x3 days, then 3 tabs PO x3 days, then 2 tabs PO x3 days, then 1 tab PO x3 days. Start 11/18/2012   40 tablet   0     BP 140/95  Pulse 118  Temp(Src) 98.2 F (36.8 C) (Oral)  Resp 20  Ht 5\' 7"  (1.702 m)  Wt 150 lb (68.04 kg)  BMI 23.49 kg/m2  SpO2 97%  Physical Exam  Nursing note and vitals reviewed. Constitutional: He is oriented to person, place, and time. He appears well-developed and well-nourished.  Non-toxic appearance.  HENT:  Head: Normocephalic.  Right Ear: Tympanic membrane and external ear normal.  Left Ear: Tympanic membrane and external ear normal.  Eyes: EOM and lids are normal. Pupils are equal, round, and reactive to light.  Neck: Normal range of motion. Neck supple. Carotid bruit is not present.  Cardiovascular: Normal rate, regular rhythm, normal heart sounds, intact distal pulses and normal  pulses.   Pulmonary/Chest: Breath sounds normal. No respiratory distress.  Abdominal: Soft. Bowel sounds are normal. There is no tenderness. There is no guarding.    Genitourinary:     Musculoskeletal: Normal range of motion.  Lymphadenopathy:       Head (right side): No submandibular adenopathy present.       Head (left side): No submandibular adenopathy present.    He has no cervical adenopathy.  Neurological: He is alert and oriented to person, place, and time. He has normal strength. No cranial nerve deficit or sensory deficit.  Skin: Skin is warm and dry.  Psychiatric: He  has a normal mood and affect. His speech is normal.    ED Course  Procedures  NEEDLE ASPIRATION OF ABSCESS RIGHT BUTTOCK.  - Patient identified by arm band. Permission for the procedure is given by the patient. Procedural time out taken before needle aspiration of the abscess of the right buttocks.  The condition of the moderate sized abscess of the right buttocks was explained to the patient. The need for incision and drainage was explained to the patient. The patient asked if some of the infection can be drained from the abscess, and he not have an incision and drainage, do to the demands of his work schedule.  The abscess area was painted with Betadine. It was infiltrated with 1% lidocaine with epi. The area was again painted with Betadine and draped in the usual sterile fashion. Using an 18-gauge needle, 6 mL of pus like material was drawn from the abscess area, and then a small to moderate amount of pus like drainage continued to flow from the drainage site. The wound was cleansed and a sterile Band-Aid applied. The patient tolerated the procedure without problem. A culture was sent to the lab.  Labs Reviewed  CULTURE, ROUTINE-ABSCESS   No results found.   No diagnosis found.    MDM  I have reviewed nursing notes, vital signs, and all appropriate lab and imaging results for this patient. Patient presents to the emergency department with 3 abscess areas on the periumbilical area. There is one large abscess area of the right buttocks. Incision and drainage was suggested to the patient, however he requests that the patient did not have incision and drainage but perhaps an aspiration because of his requirements at work this week. The dangers of not getting all of the infected material out by aspiration method was explained to the patient, and he accepts these risk. And aspiration was performed and a culture was sent to the lab.  The vital signs were reviewed, the patient has a tachycardia of  118 beats per minute otherwise the vital signs are within normal limits.  The plan at this time is for the patient to use warm saltwater soaks 2 times daily, Amoxil 500 mg 3 times daily, Septra DS 12 times daily, and Norco 7.5 mg every 4 hours as needed for pain. Patient is given a referral to surgery team for additional evaluation and management of these multiple abscess issues.       Kathie Dike, PA-C 11/22/12 1411

## 2012-11-22 NOTE — ED Provider Notes (Signed)
Medical screening examination/treatment/procedure(s) were performed by non-physician practitioner and as supervising physician I was immediately available for consultation/collaboration.  Hurman Horn, MD 11/22/12 [phone]

## 2012-11-22 NOTE — ED Notes (Signed)
Pt has been treated recently for a rash, No has an abscess to rt buttock.and swollen areas  To abd.

## 2012-11-24 ENCOUNTER — Telehealth (HOSPITAL_COMMUNITY): Payer: Self-pay | Admitting: Emergency Medicine

## 2012-11-24 LAB — CULTURE, ROUTINE-ABSCESS

## 2012-11-24 NOTE — ED Notes (Signed)
Patient informed of positive results after id'd x 2 .

## 2012-11-25 ENCOUNTER — Emergency Department (HOSPITAL_COMMUNITY)
Admission: EM | Admit: 2012-11-25 | Discharge: 2012-11-25 | Disposition: A | Discharge status: Home / Self Care | Payer: Self-pay | Attending: Emergency Medicine | Admitting: Emergency Medicine

## 2012-11-25 ENCOUNTER — Encounter (HOSPITAL_COMMUNITY): Payer: Self-pay | Admitting: *Deleted

## 2012-11-25 DIAGNOSIS — F411 Generalized anxiety disorder: Secondary | ICD-10-CM | POA: Insufficient documentation

## 2012-11-25 DIAGNOSIS — F988 Other specified behavioral and emotional disorders with onset usually occurring in childhood and adolescence: Secondary | ICD-10-CM | POA: Insufficient documentation

## 2012-11-25 DIAGNOSIS — F17200 Nicotine dependence, unspecified, uncomplicated: Secondary | ICD-10-CM | POA: Insufficient documentation

## 2012-11-25 DIAGNOSIS — Z79899 Other long term (current) drug therapy: Secondary | ICD-10-CM | POA: Insufficient documentation

## 2012-11-25 DIAGNOSIS — I10 Essential (primary) hypertension: Secondary | ICD-10-CM | POA: Insufficient documentation

## 2012-11-25 DIAGNOSIS — L0231 Cutaneous abscess of buttock: Secondary | ICD-10-CM

## 2012-11-25 DIAGNOSIS — Z4801 Encounter for change or removal of surgical wound dressing: Secondary | ICD-10-CM | POA: Insufficient documentation

## 2012-11-25 NOTE — ED Provider Notes (Signed)
History    CSN: [id number] Arrival date & time 11/25/12  1338  First MD Initiated Contact with Patient 11/25/12 1519     Chief Complaint  Patient presents with  . Wound Check   (Consider location/radiation/quality/duration/timing/severity/associated sxs/prior Treatment) HPI Comments: Preston Munoz is a 32 y.o. male who presents to the Emergency Department requesting re-evaluation of an abscess to his right buttock.  He states he was contacted by the hospital yesterday and told the abscess was positive for MRSA.  He is currently taking two antibiotics and reports the area began draining pus on the day after his previous ED visit.  He denies worsening pain, increased redness, fever, vomiting or swelling  The history is provided by the patient.   Past Medical History  Diagnosis Date  . Hypertension   . ADD (attention deficit disorder)   . Anxiety    Past Surgical History  Procedure Laterality Date  . Fracture nose surgery     No family history on file. History  Substance Use Topics  . Smoking status: Current Every Day Smoker  . Smokeless tobacco: Not on file  . Alcohol Use: No    Review of Systems  Constitutional: Negative for fever and chills.  Gastrointestinal: Negative for nausea and vomiting.  Musculoskeletal: Negative for joint swelling and arthralgias.  Skin: Positive for color change.       Abscess   Hematological: Negative for adenopathy.  All other systems reviewed and are negative.    Allergies  Review of patient's allergies indicates no known allergies.  Home Medications   Current Outpatient Rx  Name  Route  Sig  Dispense  Refill  . alprazolam (XANAX) 2 MG tablet   Oral   Take 1 mg by mouth 2 (two) times daily as needed for anxiety.          Marland Kitchen amoxicillin (AMOXIL) 500 MG capsule   Oral   Take 1 capsule (500 mg total) by mouth 3 (three) times daily.   21 capsule   0   . amphetamine-dextroamphetamine (ADDERALL) 30 MG tablet   Oral  Take 15-30 mg by mouth 3 (three) times daily. 1 in am, 1 at lunch and half tab at 4 pm         . HYDROcodone-acetaminophen (NORCO) 7.5-325 MG per tablet   Oral   Take 1 tablet by mouth every 4 (four) hours as needed for pain.   20 tablet   0   . predniSONE (DELTASONE) 10 MG tablet      Take 5 tablets PO x2 days, then take 4 tabs PO x3 days, then 3 tabs PO x3 days, then 2 tabs PO x3 days, then 1 tab PO x3 days. Start 11/18/2012   40 tablet   0   . sulfamethoxazole-trimethoprim (SEPTRA DS) 800-160 MG per tablet   Oral   Take 1 tablet by mouth 2 (two) times daily.   14 tablet   0    BP 136/103  Pulse 115  Temp(Src) 98.8 F (37.1 C) (Oral)  Ht 5\' 7"  (1.702 m)  Wt 148 lb (67.132 kg)  BMI 23.17 kg/m2  SpO2 98% Physical Exam  Nursing note and vitals reviewed. Constitutional: He is oriented to person, place, and time. He appears well-developed and well-nourished. No distress.  HENT:  Head: Normocephalic and atraumatic.  Cardiovascular: Normal rate, regular rhythm, normal heart sounds and intact distal pulses.   No murmur heard. Pulmonary/Chest: Effort normal and breath sounds normal. No respiratory distress.  Musculoskeletal:  He exhibits no edema and no tenderness.  Neurological: He is alert and oriented to person, place, and time. He exhibits normal muscle tone. Coordination normal.  Skin: Skin is warm.  Dime sized abscess to the mid right buttock without surrounding erythema or red streaks.  Mild to moderate induration and drainage.      ED Course  Procedures (including critical care time) Labs Reviewed - No data to display   MDM    Culture results reviewed.  Patient currently taking Bactrim and Amoxil.  Abscess to right buttock is draining and  Appears to be improving.  Advised pt is continue abx, warm soaks.   Abscess was cleaned by me and bandaged applied.  Alyannah Sanks L. Trisha Mangle, PA-C 11/25/12 2253

## 2012-11-25 NOTE — ED Notes (Signed)
Seen here for abscess to buttock.  States received phone call yesterday stating culture was positive for MRSA.  Here for recheck.

## 2012-11-25 NOTE — ED Notes (Signed)
Pt here for recheck of abscess to rt buttock and  Abscesses to abd.  Says he needs med for pain.  Area on rt buttock is looking better.

## 2012-11-25 NOTE — ED Notes (Signed)
Post ED Visit - Positive Culture Follow-up  Culture report reviewed by antimicrobial stewardship pharmacist: []  Wes Dulaney, Pharm.D., BCPS [x]  Celedonio Miyamoto, Pharm.D., BCPS []  Georgina Pillion, Pharm.D., BCPS []  Ronald, [place].D., BCPS, AAHIVP []  Estella Husk, Pharm.D., BCPS, AAHIVP  Positive Abscess culture Treated with Sulfa-Trim, organism sensitive to the same and no further patient follow-up is required at this time.  Larena Sox 11/25/2012, 3:03 PM

## 2012-11-25 NOTE — ED Provider Notes (Signed)
Medical screening examination/treatment/procedure(s) were performed by non-physician practitioner and as supervising physician I was immediately available for consultation/collaboration. Braeden Kennan, MD, FACEP   Jonnell Hentges L Princes Finger, MD 11/25/12 2328 

## 2012-11-25 NOTE — ED Notes (Signed)
Called from waiting room to go back to room, no answer.

## 2015-06-25 ENCOUNTER — Emergency Department (HOSPITAL_COMMUNITY): Payer: BLUE CROSS/BLUE SHIELD

## 2015-06-25 ENCOUNTER — Emergency Department (HOSPITAL_COMMUNITY)
Admission: EM | Admit: 2015-06-25 | Discharge: 2015-06-25 | Disposition: A | Discharge status: Home / Self Care | Payer: BLUE CROSS/BLUE SHIELD | Attending: Emergency Medicine | Admitting: Emergency Medicine

## 2015-06-25 ENCOUNTER — Encounter (HOSPITAL_COMMUNITY): Payer: Self-pay | Admitting: *Deleted

## 2015-06-25 DIAGNOSIS — F419 Anxiety disorder, unspecified: Secondary | ICD-10-CM | POA: Insufficient documentation

## 2015-06-25 DIAGNOSIS — Z79899 Other long term (current) drug therapy: Secondary | ICD-10-CM | POA: Diagnosis not present

## 2015-06-25 DIAGNOSIS — W010XXA Fall on same level from slipping, tripping and stumbling without subsequent striking against object, initial encounter: Secondary | ICD-10-CM | POA: Diagnosis not present

## 2015-06-25 DIAGNOSIS — Y998 Other external cause status: Secondary | ICD-10-CM | POA: Insufficient documentation

## 2015-06-25 DIAGNOSIS — Y9389 Activity, other specified: Secondary | ICD-10-CM | POA: Diagnosis not present

## 2015-06-25 DIAGNOSIS — Y9289 Other specified places as the place of occurrence of the external cause: Secondary | ICD-10-CM | POA: Diagnosis not present

## 2015-06-25 DIAGNOSIS — Z23 Encounter for immunization: Secondary | ICD-10-CM | POA: Insufficient documentation

## 2015-06-25 DIAGNOSIS — F909 Attention-deficit hyperactivity disorder, unspecified type: Secondary | ICD-10-CM | POA: Insufficient documentation

## 2015-06-25 DIAGNOSIS — S80812A Abrasion, left lower leg, initial encounter: Secondary | ICD-10-CM | POA: Insufficient documentation

## 2015-06-25 DIAGNOSIS — I10 Essential (primary) hypertension: Secondary | ICD-10-CM | POA: Diagnosis not present

## 2015-06-25 DIAGNOSIS — Z792 Long term (current) use of antibiotics: Secondary | ICD-10-CM | POA: Insufficient documentation

## 2015-06-25 DIAGNOSIS — S8012XA Contusion of left lower leg, initial encounter: Secondary | ICD-10-CM | POA: Diagnosis not present

## 2015-06-25 DIAGNOSIS — F17200 Nicotine dependence, unspecified, uncomplicated: Secondary | ICD-10-CM | POA: Insufficient documentation

## 2015-06-25 DIAGNOSIS — S8992XA Unspecified injury of left lower leg, initial encounter: Secondary | ICD-10-CM | POA: Diagnosis present

## 2015-06-25 MED ORDER — TETANUS-DIPHTH-ACELL PERTUSSIS 5-2.5-18.5 LF-MCG/0.5 IM SUSP
0.5000 mL | Freq: Once | INTRAMUSCULAR | Status: AC
  Administered 2015-06-25: 0.5 mL via INTRAMUSCULAR
  Filled 2015-06-25: qty 0.5

## 2015-06-25 MED ORDER — BACITRACIN-NEOMYCIN-POLYMYXIN 400-5-5000 EX OINT
TOPICAL_OINTMENT | Freq: Once | CUTANEOUS | Status: AC
  Administered 2015-06-25: 1 via TOPICAL
  Filled 2015-06-25: qty 3

## 2015-06-25 NOTE — ED Notes (Signed)
Wound care performed left lower leg, pt tolerated well,

## 2015-06-25 NOTE — ED Notes (Signed)
Neosporin applied, wound wrapped with telfa, kling and ace bandage, cms intact distal

## 2015-06-25 NOTE — ED Notes (Signed)
Pt states that he was rushing to get inside due to the rain when he slipped and fell, pt c/o pain and multiple abrasions noted to left lower leg, cms intact distal

## 2015-06-25 NOTE — ED Provider Notes (Signed)
CSN: [id number]     Arrival date & time 06/25/15  0030 History   First MD Initiated Contact with Patient 06/25/15 0040     Chief Complaint  Patient presents with  . Leg Pain     (Consider location/radiation/quality/duration/timing/severity/associated sxs/prior Treatment) Patient is a 35 y.o. male presenting with leg pain. The history is provided by the patient.  Leg Pain Location:  Leg Time since incident:  3 hours Injury: yes   Mechanism of injury: fall   Fall:    Fall occurred:  Running   Impact surface: deck.   Entrapped after fall: no   Leg location:  L lower leg Pain details:    Quality:  Aching   Radiates to:  Does not radiate   Severity:  Moderate   Timing:  Constant   Progression:  Worsening Chronicity:  New Dislocation: no   Foreign body present:  No foreign bodies Tetanus status:  Unknown Prior injury to area:  No Relieved by:  None tried Worsened by:  Bearing weight Ineffective treatments:  None tried Associated symptoms: swelling    Preston Munoz is a 35 y.o. male who presents to the ED with left lower leg pain. Patient report he was running to get out of the rain and slipped and fell on his deck injuring his left leg.  Past Medical History  Diagnosis Date  . Hypertension   . ADD (attention deficit disorder)   . Anxiety    Past Surgical History  Procedure Laterality Date  . Fracture nose surgery     No family history on file. Social History  Substance Use Topics  . Smoking status: Current Every Day Smoker  . Smokeless tobacco: None  . Alcohol Use: No    Review of Systems  Musculoskeletal:       Left lower leg abrasions and tenderness  all other systems negative    Allergies  Remeron  Home Medications   Prior to Admission medications   Medication Sig Start Date End Date Taking? Authorizing Provider  alprazolam Prudy Feeler) 2 MG tablet Take 1 mg by mouth 2 (two) times daily as needed for anxiety.    Yes Historical Provider, MD    amphetamine-dextroamphetamine (ADDERALL) 30 MG tablet Take 30 mg by mouth 3 (three) times daily. 1 in am, 1 at lunch and half tab at 4 pm   Yes Historical Provider, MD  amoxicillin (AMOXIL) 500 MG capsule Take 1 capsule (500 mg total) by mouth 3 (three) times daily. 11/22/12   Ivery Quale, PA-C  HYDROcodone-acetaminophen (NORCO) 7.5-325 MG per tablet Take 1 tablet by mouth every 4 (four) hours as needed for pain. 11/22/12   Ivery Quale, PA-C  predniSONE (DELTASONE) 10 MG tablet Take 5 tablets PO x2 days, then take 4 tabs PO x3 days, then 3 tabs PO x3 days, then 2 tabs PO x3 days, then 1 tab PO x3 days. Start 11/18/2012 11/17/12   Samuel Jester, DO  sulfamethoxazole-trimethoprim (SEPTRA DS) 800-160 MG per tablet Take 1 tablet by mouth 2 (two) times daily. 11/22/12   Ivery Quale, PA-C   BP 136/98 mmHg  Pulse 95  Temp(Src) 97.9 F (36.6 C)  Resp 16  Ht  (1.702 m)  Wt 79.833 kg  BMI 27.56 kg/m2  SpO2 99% Physical Exam  Constitutional: He is oriented to person, place, and time. He appears well-developed and well-nourished. No distress.  HENT:  Head: Normocephalic and atraumatic.  Eyes: EOM are normal.  Neck: Neck supple.  Cardiovascular: Normal rate.  Pulmonary/Chest: Effort normal.  Abdominal: He exhibits no distension.  Musculoskeletal: Normal range of motion.       Left lower leg: He exhibits tenderness and swelling. He exhibits no deformity. Lacerations: abrasions.       Legs: Pedal pulse 2+, adequate circulation, good touch sensation.   Neurological: He is alert and oriented to person, place, and time. No cranial nerve deficit.  Skin: Skin is warm and dry.  Psychiatric: He has a normal mood and affect. His behavior is normal.  Nursing note and vitals reviewed.   ED Course, x-ry, tetanus updated, wound care, bacitracin ointment and dressing, ace wrap.  Procedures (including critical care time) Labs Review Labs Reviewed - No data to display  Imaging Review Dg  Tibia/fibula Left  06/25/2015  CLINICAL DATA:  Slip and fall injury. Pain in multiple abrasions to the left lower leg. EXAM: LEFT TIBIA AND FIBULA - 2 VIEW COMPARISON:  None. FINDINGS: The left tibia and fibula appear intact. No evidence of acute fracture or subluxation. No focal bone lesion or bone destruction. Bone cortex and trabecular architecture appear intact. No radiopaque soft tissue foreign bodies. IMPRESSION: No acute bony abnormalities. Electronically Signed   By: Burman Nieves M.D.   On: 06/25/2015 01:12    MDM  35 y.o. male with abrasion and pain to the left lower leg s/p fall stable for d/c without fracture or foreign body to the left lower leg. Discussed with the patient x-ray findings and plan of caree and all questioned fully answered. He will return if any problems arise.   Final diagnoses:  Contusion of left lower leg, initial encounter  Abrasion of left lower leg, initial encounter       Janne Napoleon, NP 06/26/15 2249  Layla Maw Ward, DO 06/26/15 2304

## 2015-06-25 NOTE — ED Notes (Signed)
Hope NP at bedside,  

## 2018-12-07 ENCOUNTER — Encounter (HOSPITAL_COMMUNITY): Payer: Self-pay | Admitting: *Deleted

## 2018-12-07 ENCOUNTER — Emergency Department (HOSPITAL_COMMUNITY)
Admission: EM | Admit: 2018-12-07 | Discharge: 2018-12-08 | Disposition: A | Discharge status: Home / Self Care | Payer: 59 | Attending: Emergency Medicine | Admitting: Emergency Medicine

## 2018-12-07 ENCOUNTER — Other Ambulatory Visit: Payer: Self-pay

## 2018-12-07 DIAGNOSIS — L299 Pruritus, unspecified: Secondary | ICD-10-CM | POA: Diagnosis not present

## 2018-12-07 DIAGNOSIS — F909 Attention-deficit hyperactivity disorder, unspecified type: Secondary | ICD-10-CM | POA: Insufficient documentation

## 2018-12-07 DIAGNOSIS — R21 Rash and other nonspecific skin eruption: Secondary | ICD-10-CM | POA: Insufficient documentation

## 2018-12-07 DIAGNOSIS — F17210 Nicotine dependence, cigarettes, uncomplicated: Secondary | ICD-10-CM | POA: Diagnosis not present

## 2018-12-07 DIAGNOSIS — Z79899 Other long term (current) drug therapy: Secondary | ICD-10-CM | POA: Insufficient documentation

## 2018-12-07 DIAGNOSIS — I10 Essential (primary) hypertension: Secondary | ICD-10-CM | POA: Diagnosis not present

## 2018-12-07 NOTE — ED Triage Notes (Signed)
Pt reports rash that started under the left axilla and has spread on his truck and arms over the past 1.5 weeks. Saw a provider, has been using fungal cream, reports rash is still spreading.

## 2018-12-08 MED ORDER — PREDNISONE 10 MG (21) PO TBPK: ORAL_TABLET | Freq: Every day | ORAL | 0 refills | Status: DC

## 2018-12-08 MED ORDER — DEXAMETHASONE SODIUM PHOSPHATE 10 MG/ML IJ SOLN
10.0000 mg | Freq: Once | INTRAMUSCULAR | Status: AC
  Administered 2018-12-08: 10 mg via INTRAMUSCULAR
  Filled 2018-12-08: qty 1

## 2018-12-08 NOTE — Discharge Instructions (Addendum)
Take the prescribed medication as directed.  Can use topical steroid cream if you like. I strongly encourage you to follow-up with the dermatologist as psoriasis will need long term management-- call for appt. Return to the ED for new or worsening symptoms.

## 2018-12-08 NOTE — ED Provider Notes (Signed)
[facility] Provider Note   CSN: [id number] Arrival date & time: 12/07/18  2225     History   Chief Complaint Chief Complaint  Patient presents with  . Rash    HPI Preston Preston Munoz is a 38 y.o. male.     The history is provided by the patient and medical records.  Rash    38 y.o. M with hx of anxiety, ADD, hypertension, presenting to the ED with rash.  States it has been present for about a week and a half now.  States [facility] prescribed a prednisone taper as well as antifungal cream but has not noticed any significant improvement.  States rash seems to be worsening.  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  He denies any new soaps, detergents, or other personal care products.  States rash is very itchy and his skin is very dry.  He has not had any fever, cough, or upper respiratory symptoms.  Past Medical History:  Diagnosis Date  . ADD (attention deficit disorder)   . Anxiety   . Hypertension     There are no active problems to display for this patient.   Past Surgical History:  Procedure Laterality Date  . fracture nose surgery          Home Medications    Prior to Admission medications   Medication Sig Start Date End Date Taking? Authorizing Provider  alprazolam Prudy Feeler(XANAX) 2 MG tablet Take 1 mg by mouth 2 (two) times daily as needed for anxiety.     [provider]  amoxicillin (AMOXIL) 500 MG capsule Take 1 capsule (500 mg total) by mouth 3 (three) times daily. 11/22/12   Ivery QualeBryant, Hobson, PA-C  amphetamine-dextroamphetamine (ADDERALL) 30 MG tablet Take 30 mg by mouth 3 (three) times daily. 1 in am, 1 at lunch and half tab at 4 pm    [provider]  HYDROcodone-acetaminophen (NORCO) 7.5-325 MG per tablet Take 1 tablet by mouth every 4 (four) hours as needed for pain. 11/22/12   Ivery QualeBryant, Hobson, PA-C   predniSONE (DELTASONE) 10 MG tablet Take 5 tablets PO x2 days, then take 4 tabs PO x3 days, then 3 tabs PO x3 days, then 2 tabs PO x3 days, then 1 tab PO x3 days. Start 11/18/2012 11/17/12   Samuel JesterMcManus, Kathleen, DO  sulfamethoxazole-trimethoprim (SEPTRA DS) 800-160 MG per tablet Take 1 tablet by mouth 2 (two) times daily. 11/22/12   Ivery QualeBryant, Hobson, PA-C    Family History No family history on file.  Social History Social History   Tobacco Use  . Smoking status: Current Every Day Smoker  Substance Use Topics  . Alcohol use: No  . Drug use: No     Allergies   Remeron [mirtazapine]   Review of Systems Review of Systems  Skin: Positive for rash.  All other systems reviewed and are negative.    Physical Exam Updated Vital Signs BP (!) 142/103 (BP Location: Right Arm)   Pulse (!) 132   Temp 99.6 F (37.6 C) (Oral)   Resp 18   SpO2 98%   Physical Exam Vitals signs and nursing note reviewed.  Constitutional:      Appearance: He is well-developed.  HENT:     Head: Normocephalic and atraumatic.  Eyes:     Conjunctiva/sclera: Conjunctivae normal.     Pupils: Pupils are equal, round, and  reactive to light.  Neck:     Musculoskeletal: Normal range of motion.  Cardiovascular:     Rate and Rhythm: Normal rate and regular rhythm.     Heart sounds: Normal heart sounds.  Pulmonary:     Effort: Pulmonary effort is normal.     Breath sounds: Normal breath sounds.  Abdominal:     General: Bowel sounds are normal.     Palpations: Abdomen is soft.  Musculoskeletal: Normal range of motion.  Skin:    General: Skin is warm and dry.     Findings: Rash present.     Comments: Very large psoriasis plaques to right elbow, right knee, left forearm, and both hands with scaling centers; there are also very small, grouped, circular plaques beneath both axilla and on the chest, abdomen, and back, each of these have scaling centers as well No lesions on palms/soles, no signs of superimposed  infection  Neurological:     Mental Status: He is alert and oriented to person, place, and time.      ED Treatments / Results  Labs (all labs ordered are listed, but only abnormal results are displayed) Labs Reviewed - No data to display  EKG None  Radiology No results found.  Procedures Procedures (including critical care time)  Medications Ordered in ED Medications - No data to display   Initial Impression / Assessment and Plan / ED Course  I have reviewed the triage vital signs and the nursing notes.  Pertinent labs & imaging results that were available during my care of the patient were reviewed by me and considered in my medical decision making (see chart for details).  38 year old male here with rash.  This is been present for a week and a half now.  He was seen at urgent care and given a short prednisone taper as well as antifungal cream but did not noticed any improvement.  He is afebrile and nontoxic.  On exam he has a very large psoriatic appearing plaques to all of his extremities as well as smaller, grouped, circular plaques on the trunk and back.  All of these areas have dry, scaling skin on top.  There is not any drainage or signs of superimposed infection.  No lesions on the palms or soles.  He denies any new soaps, detergents, or other personal care products.  He has not had any fever or upper respiratory symptoms.  I feel that this is likely psoriasis.  Will restart prednisone here, can continue using topical agents.  I discussed with him that he likely will need to follow-up with dermatology for long-term management of this.  He can return here for any new or acute changes.  Final Clinical Impressions(s) / ED Diagnoses   Final diagnoses:  Rash    ED Discharge Orders         Ordered     12/08/18 0022    predniSONE (STERAPRED UNI-PAK 21 TAB) 10 MG (21) TBPK tablet  Daily     12/08/18 0025           Larene Pickett, PA-C 12/08/18 0026    Fatima Blank, MD 12/12/18 [phone]

## 2019-04-06 ENCOUNTER — Other Ambulatory Visit: Payer: Self-pay

## 2019-04-06 DIAGNOSIS — Z20822 Contact with and (suspected) exposure to covid-19: Secondary | ICD-10-CM

## 2019-04-07 LAB — NOVEL CORONAVIRUS, NAA: SARS-CoV-2, NAA: NOT DETECTED

## 2019-09-27 ENCOUNTER — Emergency Department (HOSPITAL_COMMUNITY): Payer: No Typology Code available for payment source

## 2019-09-27 ENCOUNTER — Other Ambulatory Visit: Payer: Self-pay

## 2019-09-27 ENCOUNTER — Emergency Department (HOSPITAL_COMMUNITY)
Admission: EM | Admit: 2019-09-27 | Discharge: 2019-09-27 | Disposition: A | Discharge status: Home / Self Care | Payer: No Typology Code available for payment source | Attending: Emergency Medicine | Admitting: Emergency Medicine

## 2019-09-27 ENCOUNTER — Encounter (HOSPITAL_COMMUNITY): Payer: Self-pay | Admitting: Emergency Medicine

## 2019-09-27 DIAGNOSIS — F17210 Nicotine dependence, cigarettes, uncomplicated: Secondary | ICD-10-CM | POA: Insufficient documentation

## 2019-09-27 DIAGNOSIS — I10 Essential (primary) hypertension: Secondary | ICD-10-CM | POA: Diagnosis not present

## 2019-09-27 DIAGNOSIS — Z79899 Other long term (current) drug therapy: Secondary | ICD-10-CM | POA: Insufficient documentation

## 2019-09-27 DIAGNOSIS — F909 Attention-deficit hyperactivity disorder, unspecified type: Secondary | ICD-10-CM | POA: Insufficient documentation

## 2019-09-27 DIAGNOSIS — R911 Solitary pulmonary nodule: Secondary | ICD-10-CM | POA: Diagnosis not present

## 2019-09-27 DIAGNOSIS — R1031 Right lower quadrant pain: Secondary | ICD-10-CM | POA: Insufficient documentation

## 2019-09-27 HISTORY — DX: Psoriasis, unspecified: L40.9

## 2019-09-27 LAB — COMPREHENSIVE METABOLIC PANEL
ALT: 24 U/L (ref 0–44)
AST: 19 U/L (ref 15–41)
Albumin: 4.6 g/dL (ref 3.5–5.0)
Alkaline Phosphatase: 65 U/L (ref 38–126)
Anion gap: 12 (ref 5–15)
BUN: 20 mg/dL (ref 6–20)
CO2: 26 mmol/L (ref 22–32)
Calcium: 10.1 mg/dL (ref 8.9–10.3)
Chloride: 98 mmol/L (ref 98–111)
Creatinine, Ser: 1.21 mg/dL (ref 0.61–1.24)
GFR calc Af Amer: >60 mL/min (ref >60)
GFR calc non Af Amer: >60 mL/min (ref >60)
Glucose, Bld: 102 mg/dL — ABNORMAL HIGH (ref 70–99)
Potassium: 3.9 mmol/L (ref 3.5–5.1)
Sodium: 136 mmol/L (ref 135–145)
Total Bilirubin: 1 mg/dL (ref 0.3–1.2)
Total Protein: 7.8 g/dL (ref 6.5–8.1)

## 2019-09-27 LAB — CBC WITH DIFFERENTIAL/PLATELET
Abs Immature Granulocytes: 0.02 10*3/uL (ref 0.00–0.07)
Basophils Absolute: 0.1 10*3/uL (ref 0.0–0.1)
Basophils Relative: 1 %
Eosinophils Absolute: 0.4 10*3/uL (ref 0.0–0.5)
Eosinophils Relative: 6 %
HCT: 48.2 % (ref 39.0–52.0)
Hemoglobin: 16.6 g/dL (ref 13.0–17.0)
Immature Granulocytes: 0 %
Lymphocytes Relative: 32 %
Lymphs Abs: 2.3 10*3/uL (ref 0.7–4.0)
MCH: 31.1 pg (ref 26.0–34.0)
MCHC: 34.4 g/dL (ref 30.0–36.0)
MCV: 90.3 fL (ref 80.0–100.0)
Monocytes Absolute: 0.7 10*3/uL (ref 0.1–1.0)
Monocytes Relative: 10 %
Neutro Abs: 3.6 10*3/uL (ref 1.7–7.7)
Neutrophils Relative %: 51 %
Platelets: 344 10*3/uL (ref 150–400)
RBC: 5.34 MIL/uL (ref 4.22–5.81)
RDW: 12.9 % (ref 11.5–15.5)
WBC: 7.1 10*3/uL (ref 4.0–10.5)
nRBC: 0 % (ref 0.0–0.2)

## 2019-09-27 LAB — URINALYSIS, ROUTINE W REFLEX MICROSCOPIC
Bacteria, UA: NONE SEEN
Bilirubin Urine: NEGATIVE
Glucose, UA: NEGATIVE mg/dL
Hgb urine dipstick: NEGATIVE
Ketones, ur: NEGATIVE mg/dL
Nitrite: NEGATIVE
Protein, ur: NEGATIVE mg/dL
Specific Gravity, Urine: 1.016 (ref 1.005–1.030)
pH: 6 (ref 5.0–8.0)

## 2019-09-27 LAB — LIPASE, BLOOD: Lipase: 33 U/L (ref 11–51)

## 2019-09-27 MED ORDER — IOHEXOL 300 MG/ML  SOLN
100.0000 mL | Freq: Once | INTRAMUSCULAR | Status: AC | PRN
  Administered 2019-09-27: 12:00:00 100 mL via INTRAVENOUS

## 2019-09-27 NOTE — ED Triage Notes (Signed)
Pain started last night prior to going to bed on right lower side of ABD.  Pain gets better when lying in a fetal position.  Last BM was this morning patient states it was normal.  Denies nausea

## 2019-09-27 NOTE — Discharge Instructions (Signed)
Your labs and imaging are reassuring today with no sign of infection, especially no appendicitis.  You do have an incidental finding of a small lung nodule in your right mid lung field.  Given that you are a smoker, it is recommended that you have a CT scan in 12 months to follow this to ensure it is stable.  You should obtain a primary MD - see the suggestions below.

## 2019-09-27 NOTE — ED Notes (Signed)
Pt transported to CT ?

## 2019-09-27 NOTE — ED Provider Notes (Signed)
[facility] Provider Note   CSN: [id number] Arrival date & time: 09/27/19  0865     History No chief complaint on file.   REGNALD BOWENS is a 39 y.o. male with history of hypertension, anxiety and psoriasis presenting with a 24-hour history of right lower quadrant abdominal pain.  He reports the pain started nonspecifically in his periumbilical region and became very sharp and painful over the course of last night.  He states that certain positions make his pain worse including stretching and walking and it feels better when he lies still in the fetal position.  His last bowel movement was this morning and normal.  At this BM did not improve or worsen his pain.  He denies nausea or vomiting, no fevers or chills.  He denies anorexia.  He also denies dysuria or hematuria, no back or flank pain.  Also no testicular pain.  He has had no treatment prior to arrival.  Just before his arrival his pain was very sharp in intensity, it is now improved to where it is described as a dull ache.  HPI     Past Medical History:  Diagnosis Date  . ADD (attention deficit disorder)   . Anxiety   . Hypertension   . Psoriasis     There are no problems to display for this patient.   Past Surgical History:  Procedure Laterality Date  . fracture nose surgery         No family history on file.  Social History   Tobacco Use  . Smoking status: Current Every Day Smoker    Packs/day: 1.00    Years: 5.00    Pack years: 5.00    Types: Cigarettes  . Smokeless tobacco: Never Used  Substance Use Topics  . Alcohol use: No  . Drug use: Yes    Types: Marijuana    Comment: twice this past year    Home Medications Prior to Admission medications   Medication Sig Start Date End Date Taking? Authorizing Provider  alprazolam Duanne Moron) 2 MG tablet Take 1 mg by mouth 2 (two) times daily as needed for anxiety.     [provider]  amoxicillin (AMOXIL) 500 MG capsule Take  1 capsule (500 mg total) by mouth 3 (three) times daily. 11/22/12   Lily Kocher, PA-C  amphetamine-dextroamphetamine (ADDERALL) 30 MG tablet Take 30 mg by mouth 3 (three) times daily. 1 in am, 1 at lunch and half tab at 4 pm    [provider]  HYDROcodone-acetaminophen (NORCO) 7.5-325 MG per tablet Take 1 tablet by mouth every 4 (four) hours as needed for pain. 11/22/12   Lily Kocher, PA-C  predniSONE (STERAPRED UNI-PAK 21 TAB) 10 MG (21) TBPK tablet Take by mouth daily. Take 6 tabs by mouth daily  for 2 days, then 5 tabs for 2 days, then 4 tabs for 2 days, then 3 tabs for 2 days, 2 tabs for 2 days, then 1 tab by mouth daily for 2 days 12/08/18   Larene Pickett, PA-C  sulfamethoxazole-trimethoprim (SEPTRA DS) 800-160 MG per tablet Take 1 tablet by mouth 2 (two) times daily. 11/22/12   Lily Kocher, PA-C    Allergies    Remeron [mirtazapine]  Review of Systems   Review of Systems  Constitutional: Negative for chills and fever.  HENT: Negative for congestion.   Eyes: Negative.   Respiratory: Negative for chest tightness and shortness of breath.   Cardiovascular: Negative for chest pain.  Gastrointestinal:  Positive for abdominal pain. Negative for constipation, diarrhea, nausea and vomiting.  Genitourinary: Negative.  Negative for dysuria and hematuria.  Musculoskeletal: Negative for arthralgias, joint swelling and neck pain.  Skin: Negative.  Negative for rash and wound.  Neurological: Negative for dizziness.  Psychiatric/Behavioral: Negative.     Physical Exam Updated Vital Signs BP (!) 143/105   Pulse 74   Temp 98.1 F (36.7 C) (Oral)   Resp 18   Ht 5\' 7"  (1.702 m)   Wt 87.5 kg   SpO2 100%   BMI 30.23 kg/m   Physical Exam Vitals and nursing note reviewed.  Constitutional:      Appearance: He is well-developed.  HENT:     Head: Normocephalic and atraumatic.  Eyes:     Conjunctiva/sclera: Conjunctivae normal.  Cardiovascular:     Rate and Rhythm: Normal rate  and regular rhythm.     Heart sounds: Normal heart sounds.     Comments: Initial VS reviewed, not tachy on exam Pulmonary:     Effort: Pulmonary effort is normal.     Breath sounds: Normal breath sounds. No wheezing.  Abdominal:     General: Bowel sounds are normal.     Palpations: Abdomen is soft.     Tenderness: There is abdominal tenderness in the right lower quadrant. There is no right CVA tenderness, left CVA tenderness, guarding or rebound.     Hernia: No hernia is present.  Musculoskeletal:        General: Normal range of motion.     Cervical back: Normal range of motion.  Skin:    General: Skin is warm and dry.  Neurological:     Mental Status: He is alert.     ED Results / Procedures / Treatments   Labs (all labs ordered are listed, but only abnormal results are displayed) Labs Reviewed  COMPREHENSIVE METABOLIC PANEL - Abnormal; Notable for the following components:      Result Value   Glucose, Bld 102 (*)    All other components within normal limits  URINALYSIS, ROUTINE W REFLEX MICROSCOPIC - Abnormal; Notable for the following components:   Leukocytes,Ua TRACE (*)    All other components within normal limits  LIPASE, BLOOD  CBC WITH DIFFERENTIAL/PLATELET    EKG None  Radiology CT ABDOMEN PELVIS W CONTRAST  Result Date: 09/27/2019 CLINICAL DATA:  Right lower quadrant pain since last night. EXAM: CT ABDOMEN AND PELVIS WITH CONTRAST TECHNIQUE: Multidetector CT imaging of the abdomen and pelvis was performed using the standard protocol following bolus administration of intravenous contrast. CONTRAST:  09/29/2019 OMNIPAQUE IOHEXOL 300 MG/ML  SOLN COMPARISON:  06/26/2008 FINDINGS: Lower chest: 3 mm right middle lobe nodule identified on image 7/4. This area of the lung was not included on the prior study. Calcified granuloma noted left posterior costophrenic sulcus. Hepatobiliary: Innumerable tiny hypoattenuating lesions are seen in the liver, much too small to characterize  but likely benign. There is no evidence for gallstones, gallbladder wall thickening, or pericholecystic fluid. No intrahepatic or extrahepatic biliary dilation. Pancreas: No focal mass lesion. No dilatation of the main duct. No intraparenchymal cyst. No peripancreatic edema. Spleen: No splenomegaly. No focal mass lesion. Adrenals/Urinary Tract: No adrenal nodule or mass. Kidneys unremarkable No evidence for hydroureter. The urinary bladder appears normal for the degree of distention. Stomach/Bowel: Stomach is unremarkable. No gastric wall thickening. No evidence of outlet obstruction. Duodenum is normally positioned as is the ligament of Treitz. No small bowel wall thickening. No small bowel dilatation. The  terminal ileum is normal. The appendix is normal. No gross colonic mass. No colonic wall thickening. Vascular/Lymphatic: No abdominal aortic aneurysm. No abdominal aortic atherosclerotic calcification. There is no gastrohepatic or hepatoduodenal ligament lymphadenopathy. No retroperitoneal or mesenteric lymphadenopathy. No pelvic sidewall lymphadenopathy. Reproductive: The prostate gland and seminal vesicles are unremarkable. Other: No intraperitoneal free fluid. Musculoskeletal: No worrisome lytic or sclerotic osseous abnormality. Bilateral pars interarticularis defects noted at L5. IMPRESSION: 1. No acute findings in the abdomen or pelvis. Specifically, no findings to explain the patient's history of right lower quadrant pain. 2. Innumerable tiny hypoattenuating lesions in the liver, much too small to characterize but likely benign. 3. 3 mm right middle lobe pulmonary nodule. Given patient age and lack of cancer history, this is almost assuredly benign. No follow-up needed if patient is low-risk. Non-contrast chest CT can be considered in 12 months if patient is high-risk. Electronically Signed   By: Kennith Center M.D.   On: 09/27/2019 12:41    Procedures Procedures (including critical care time)   Medications Ordered in ED Medications  iohexol (OMNIPAQUE) 300 MG/ML solution 100 mL (100 mLs Intravenous Contrast Given 09/27/19 1211)    ED Course  I have reviewed the triage vital signs and the nursing notes.  Pertinent labs & imaging results that were available during my care of the patient were reviewed by me and considered in my medical decision making (see chart for details).    MDM Rules/Calculators/A&P                      Labs and imaging reviewed and are reassuring.  Patient with abdominal pain of unclear etiology, but there is no intra-abdominal infection specifically no appendicitis.  Also no obstruction.  His labs are normal.  He does have a nodule in his right mid lung field and he is a smoker, recommendation for noncontrast CT scan in 12 months, this information was given patient he was given referrals for obtaining a PCP.  He was almost pain-free at time of discharge.  As needed follow-up anticipated. Final Clinical Impression(s) / ED Diagnoses Final diagnoses:  Right lower quadrant abdominal pain  Pulmonary nodule seen on imaging study    Rx / DC Orders ED Discharge Orders    None       Victoriano Lain 09/27/19 1322    Blane Ohara, MD 09/27/19 1521

## 2020-01-31 ENCOUNTER — Encounter (HOSPITAL_COMMUNITY): Payer: Self-pay | Admitting: *Deleted

## 2020-01-31 ENCOUNTER — Emergency Department (HOSPITAL_COMMUNITY)
Admission: EM | Admit: 2020-01-31 | Discharge: 2020-01-31 | Disposition: A | Discharge status: Home / Self Care | Payer: No Typology Code available for payment source | Attending: Emergency Medicine | Admitting: Emergency Medicine

## 2020-01-31 ENCOUNTER — Other Ambulatory Visit: Payer: Self-pay

## 2020-01-31 DIAGNOSIS — N4889 Other specified disorders of penis: Secondary | ICD-10-CM | POA: Insufficient documentation

## 2020-01-31 DIAGNOSIS — Z5321 Procedure and treatment not carried out due to patient leaving prior to being seen by health care provider: Secondary | ICD-10-CM | POA: Diagnosis not present

## 2020-01-31 NOTE — ED Triage Notes (Signed)
Pt has a new dog with nails needing to be trimmed and has not been done yet, the dog jumped up on the bed that pt was lying on with no clothes on at the time.  A nail caught tip of pt's penis and scratched it, pt had some burning with urination afterwards.   Pt denies any fevers or N/V. Swelling noted to area per pt.

## 2021-02-09 IMAGING — CT CT ABD-PELV W/ CM
2 of 4 series · 15 of 46 positions shown, 17 images · IV contrast (Omnipaque or Isovue)
Comparison: 06/26/2008

CLINICAL DATA: Right lower quadrant pain since last night.

EXAM:
CT ABDOMEN AND PELVIS WITH CONTRAST
TECHNIQUE: Multidetector CT imaging of the abdomen and pelvis was performed
using the standard protocol following bolus administration of
intravenous contrast.
CONTRAST:  100mL OMNIPAQUE IOHEXOL 300 MG/ML  SOLN

[Series 2: axial st · axial · 0.73mm/px · z∈[+905,+1365]mm · 12 of 102 slices shown, 14 images]
[im 5/102  soft-tissue]
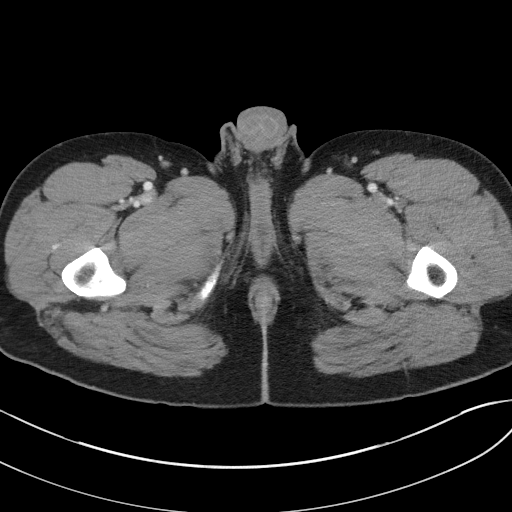
[im 5/102  bone]
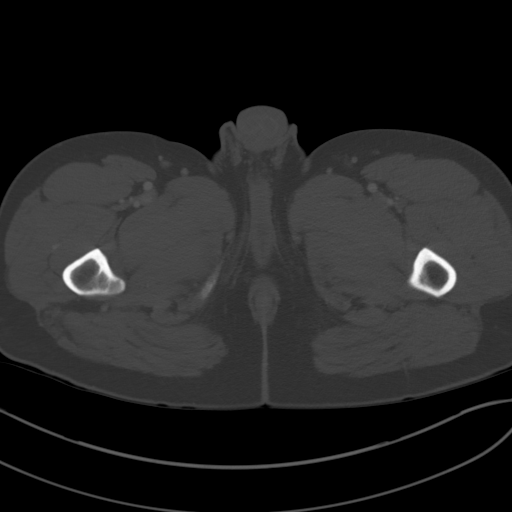
[im 14/102  soft-tissue]
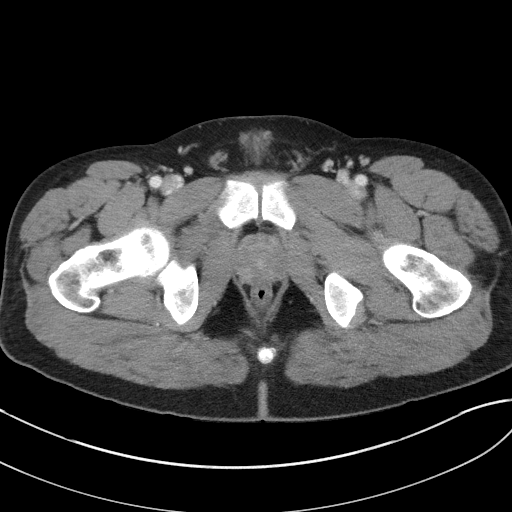
[im 23/102  soft-tissue]
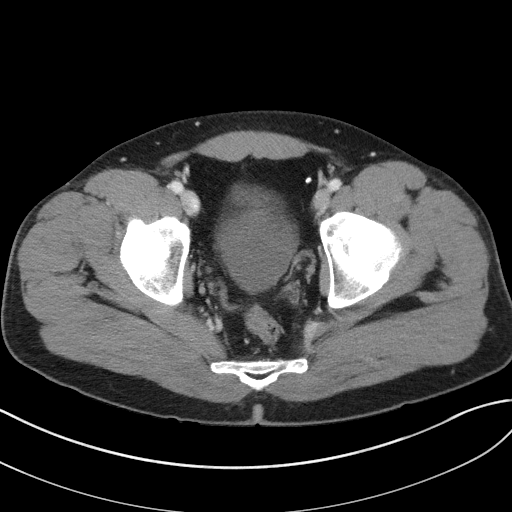
[im 33/102  soft-tissue]
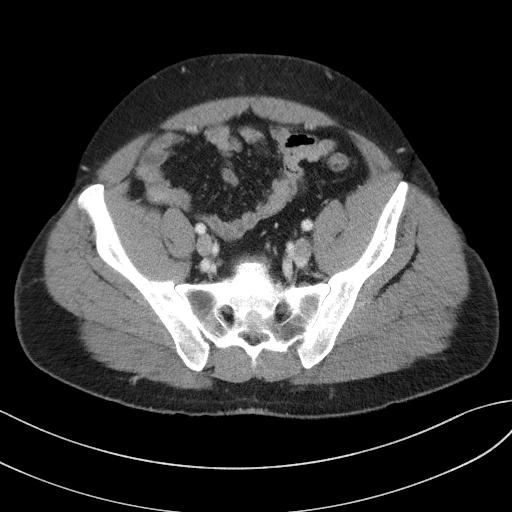
[im 37/102  soft-tissue]
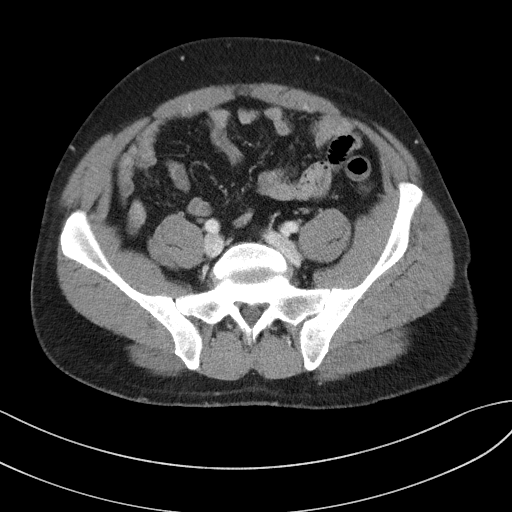
[im 46/102  soft-tissue]
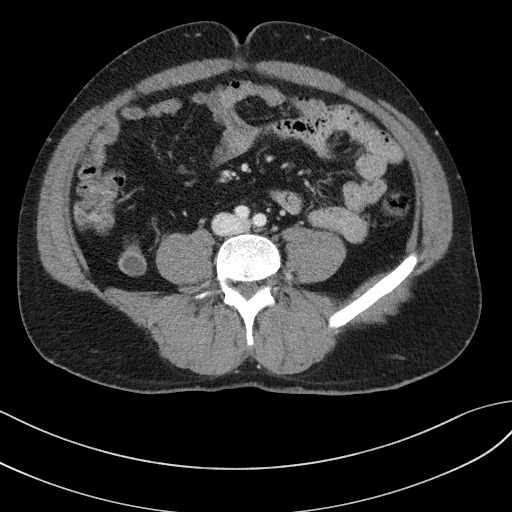
[im 56/102  soft-tissue]
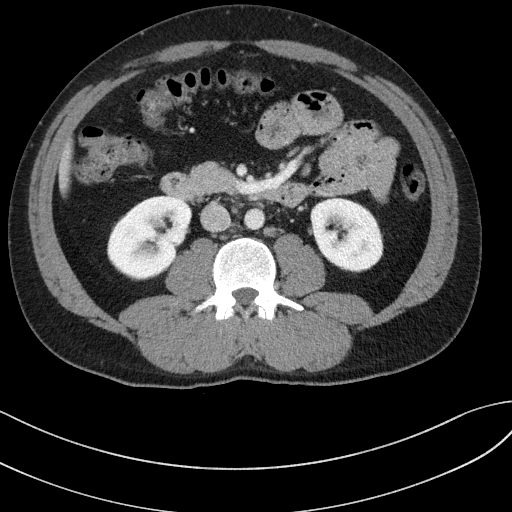
[im 65/102  soft-tissue]
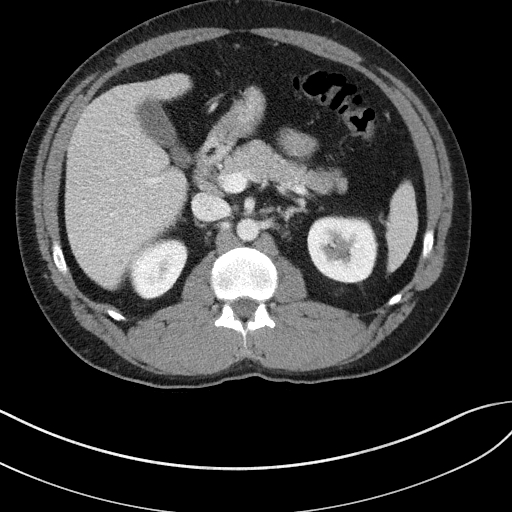
[im 69/102  soft-tissue]
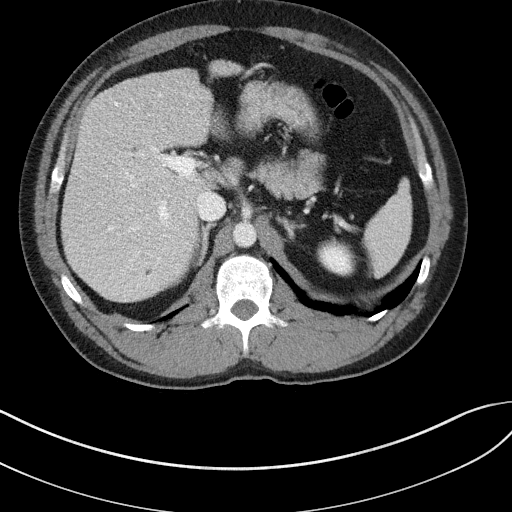
[im 69/102  bone]
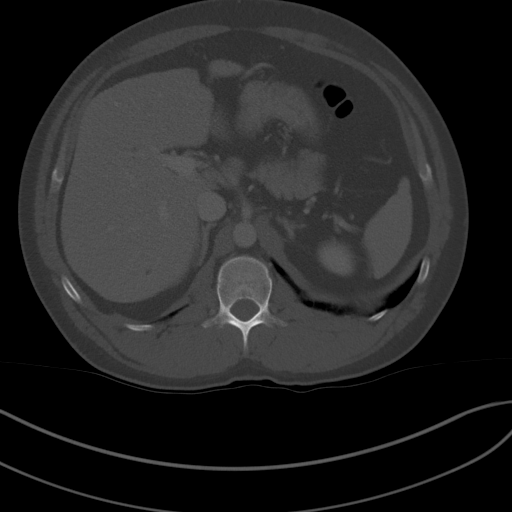
[im 79/102  soft-tissue]
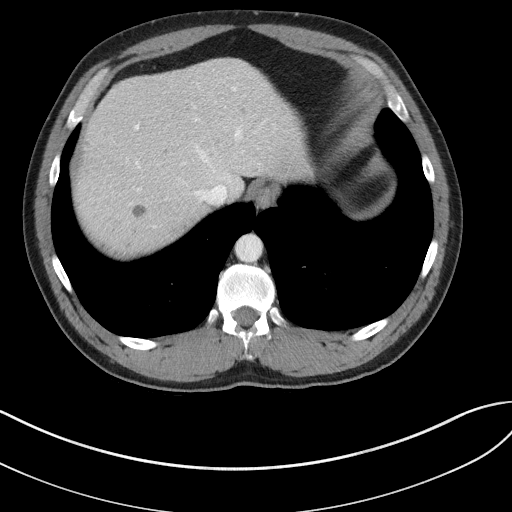
[im 88/102  soft-tissue]
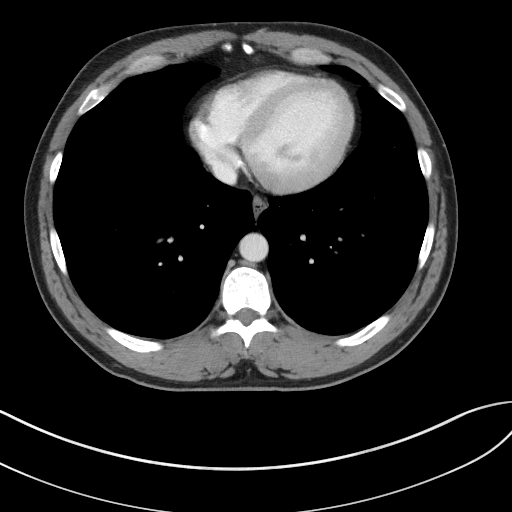
[im 97/102  soft-tissue]
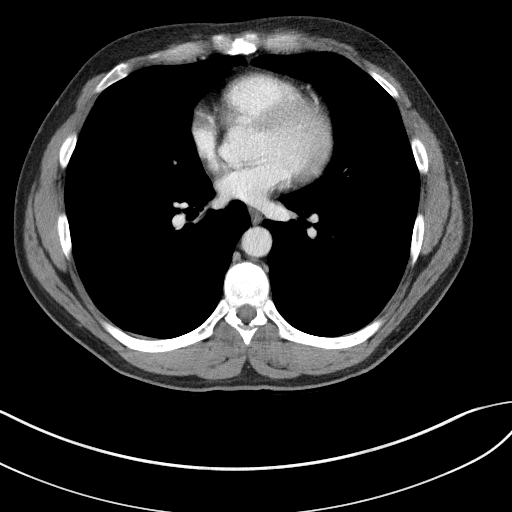

[Series 5: coronal st · coronal · 0.77mm/px · 3 of 117 slices shown]
[im 39/117  soft-tissue]
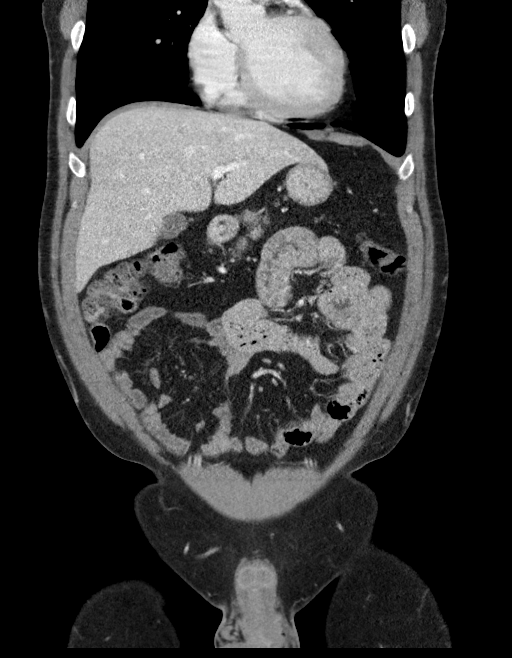
[im 52/117  soft-tissue]
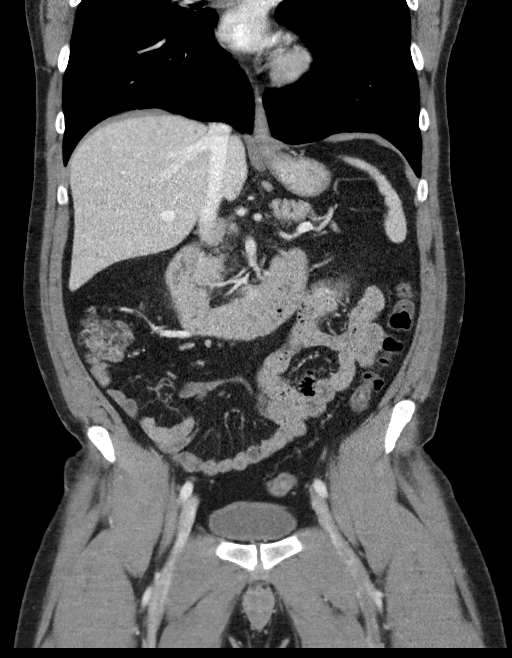
[im 65/117  soft-tissue]
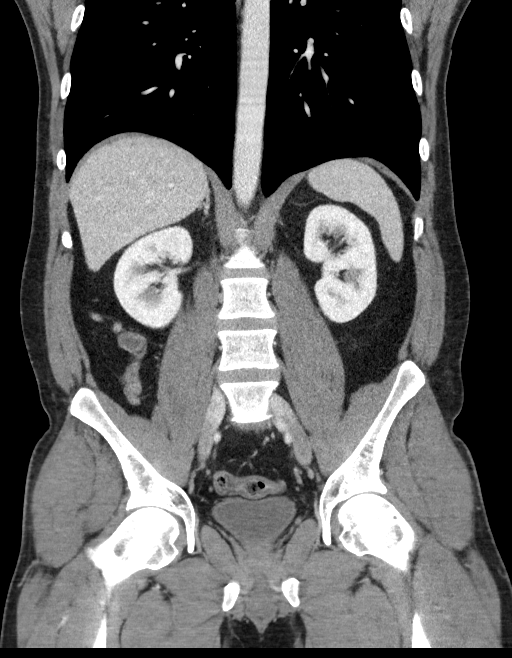

[15 of 46 positions shown; findings below may reference images not displayed]

FINDINGS: Lower chest: 3 mm right middle lobe nodule identified on image [DATE].
This area of the lung was not included on the prior study. Calcified
granuloma noted left posterior costophrenic sulcus.

Hepatobiliary: Innumerable tiny hypoattenuating lesions are seen in
the liver, much too small to characterize but likely benign. There
is no evidence for gallstones, gallbladder wall thickening, or
pericholecystic fluid. No intrahepatic or extrahepatic biliary
dilation.

Pancreas: No focal mass lesion. No dilatation of the main duct. No
intraparenchymal cyst. No peripancreatic edema.

Spleen: No splenomegaly. No focal mass lesion.

Adrenals/Urinary Tract: No adrenal nodule or mass. Kidneys
unremarkable No evidence for hydroureter. The urinary bladder
appears normal for the degree of distention.

Stomach/Bowel: Stomach is unremarkable. No gastric wall thickening.
No evidence of outlet obstruction. Duodenum is normally positioned
as is the ligament of Treitz. No small bowel wall thickening. No
small bowel dilatation. The terminal ileum is normal. The appendix
is normal. No gross colonic mass. No colonic wall thickening.

Vascular/Lymphatic: No abdominal aortic aneurysm. No abdominal
aortic atherosclerotic calcification. There is no gastrohepatic or
hepatoduodenal ligament lymphadenopathy. No retroperitoneal or
mesenteric lymphadenopathy. No pelvic sidewall lymphadenopathy.

Reproductive: The prostate gland and seminal vesicles are
unremarkable.

Other: No intraperitoneal free fluid.

Musculoskeletal: No worrisome lytic or sclerotic osseous
abnormality. Bilateral pars interarticularis defects noted at L5.
IMPRESSION: 1. No acute findings in the abdomen or pelvis. Specifically, no
findings to explain the patient's history of right lower quadrant
pain.
2. Innumerable tiny hypoattenuating lesions in the liver, much too
small to characterize but likely benign.
3. 3 mm right middle lobe pulmonary nodule. Given patient age and
lack of cancer history, this is almost assuredly benign. No
follow-up needed if patient is low-risk. Non-contrast chest CT can
be considered in 12 months if patient is high-risk.

## 2021-06-15 ENCOUNTER — Ambulatory Visit: Payer: No Typology Code available for payment source

## 2022-07-03 ENCOUNTER — Encounter: Payer: Self-pay | Admitting: Internal Medicine

## 2022-08-12 NOTE — Progress Notes (Unsigned)
Referring Provider:  Olga Coaster, FNP Primary Care Physician:  Olga Coaster, FNP Primary Gastroenterologist:  Dr. Abbey Chatters  Chief Complaint  Patient presents with   Abdominal Pain    Pain in stomach, coughing up blood. Had an ulcer a couple years ago.     HPI:   Preston Munoz is a 42 y.o. male presenting today at the request of Bucio, [place], FNP for abdominal pain and hemorrhoids.  Reviewed care everywhere: RUQ [place] 07/08/22: Hyperechoic mass in the right hepatic lobe.  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  Recommended follow-up right upper quadrant ultrasound in 6-12 months.  Otherwise MRI abdomen could be considered.  Reviewed office visit with Rubin Payor, FNP dated 07/01/2022.  Patient reported generalized abdominal pain, worse in the morning when having a bowel movement.  Also with nausea with hematemesis.  Reported history of stomach ulcers in the early 2000's.  He had generalized tenderness to palpation on exam.  Labs including CBC, CMP, lipase, amylase were ordered and patient was furred to GI.  Recommended continuing omeprazole 40 mg daily and adding famotidine 20 mg at bedtime.  Lab results were not included in referral.  Today: Developed nausea/vomiting for about 1 week a couple months ago.  He had 1 episode of hematemesis which scared him.  That is the first time it ever happened.  He does have history of an esophageal ulcer 10-15 years ago. [facility] E[facility] and found. Was diagnosed with H pylori. Took an antibiotic. Has intermittent acid reflux every few days. No real pattern. No identified triggers. Was taking omeprazole 40 mg daily. Has been out for 2 weeks, worked, but not perfect. Still had some symptoms. No further nausea/vomiting. No upper abdominal pain. Feels like he can feel his food transition into his stomach. Will take a minute for his food to pass. Some mild pain with this.   Tries to avoid NSAIDs.  Rare goody powder use if he doesn't have Tylenol.   Has bad stomach cramping in the morning. Lower abdomen. Before, during, and after a BM. Eventually pain fades away. Warm water helps with this. No nausea/vomiting with this. Stools are sometimes liquidly and sometimes solid. No brbpr or melena. Started about 5 years or so age. Comes and goes. Not every day. Can go up to a few weeks without symptoms. When symptoms start, they last for a  couple of weeks. Usually just 1-2 BM in the morning. When having stomach issues, will have 3-4 BMs in the morning.   Denies hemorrhoids.   Was adopted. Doesn't know Fhx.  Past Medical History:  Diagnosis Date   ADD (attention deficit disorder)    Anxiety    Hypertension    Psoriasis     Past Surgical History:  Procedure Laterality Date   fracture nose surgery      Current Outpatient Medications  Medication Sig Dispense Refill   alprazolam (XANAX) 2 MG tablet Take 1 mg by mouth 2 (two) times daily as needed for anxiety.      amphetamine-dextroamphetamine (ADDERALL) 30 MG tablet Take 30 mg by mouth 3 (three) times daily. 1 in am, 1 at lunch and half tab at 4 pm     buPROPion (WELLBUTRIN XL) 150 MG 24 hr tablet Take 150 mg by mouth every morning.     buPROPion (WELLBUTRIN XL) 300 MG 24 hr tablet Take 300 mg by mouth daily.  dicyclomine (BENTYL) 10 MG capsule Take 1 capsule (10 mg total) by mouth up to 3 times daily for abdominal cramping. 90 capsule 1   escitalopram (LEXAPRO) 20 MG tablet Take 20 mg by mouth daily.     lisinopril (ZESTRIL) 20 MG tablet Take 20 mg by mouth daily.     pantoprazole (PROTONIX) 40 MG tablet Take 1 tablet (40 mg total) by mouth daily before breakfast. 30 tablet 3   No current facility-administered medications for this visit.    Allergies as of 08/14/2022 - Review Complete 08/14/2022  Allergen Reaction Noted   Remeron [mirtazapine]  06/25/2015    Family History  Adopted: Yes  Family history unknown: Yes     Social History   Socioeconomic History   Marital status: Single    Spouse name: Not on file   Number of children: Not on file   Years of education: Not on file   Highest education level: Not on file  Occupational History   Not on file  Tobacco Use   Smoking status: Every Day    Packs/day: 1.00    Years: 5.00    Additional pack years: 0.00    Total pack years: 5.00    Types: Cigarettes   Smokeless tobacco: Never  Vaping Use   Vaping Use: Never used  Substance and Sexual Activity   Alcohol use: No   Drug use: Yes    Types: Marijuana    Comment: denies use   Sexual activity: Not on file  Other Topics Concern   Not on file  Social History Narrative   Not on file   Social Determinants of Health   Financial Resource Strain: Not on file  Food Insecurity: Not on file  Transportation Needs: Not on file  Physical Activity: Not on file  Stress: Not on file  Social Connections: Not on file  Intimate Partner Violence: Not on file    Review of Systems: Gen: Denies any fever, chills, cold or flulike symptoms, presyncope, syncope. CV: Denies chest pain, heart palpitations. Resp: Denies shortness of breath, cough. GI: See HPI GU : Denies urinary burning, urinary frequency, urinary hesitancy MS: Denies joint pain. Derm: Has psoriasis. Psych: Denies depression, anxiety. Heme: See HPI  Physical Exam: BP 136/76 (BP Location: Right Arm, Patient Position: Sitting, Cuff Size: Normal)   Pulse 77   Temp (!) 97.5 F (36.4 C) (Temporal)   Ht '5\' 7"'$  (1.702 m)   Wt 207 lb 3.2 oz (94 kg)   SpO2 98%   BMI 32.45 kg/m  General:   Alert and oriented. Pleasant and cooperative. Well-nourished and well-developed.  Head:  Normocephalic and atraumatic. Eyes:  Without icterus, sclera clear and conjunctiva pink.  Ears:  Normal auditory acuity. Lungs:  Clear to auscultation bilaterally. No wheezes, rales, or rhonchi. No distress.  Heart:  S1, S2 present without murmurs appreciated.   Abdomen:  +BS, soft, non-tender and non-distended. No HSM noted. No guarding or rebound. No masses appreciated.  Rectal:  Deferred  Msk:  Symmetrical without gross deformities. Normal posture. Extremities:  Without edema. Neurologic:  Alert and  oriented x4;  grossly normal neurologically. Skin:  Intact without significant lesions or rashes. Psych:   Normal mood and affect.    Assessment:  42 year old male with history of anxiety, ADD, HTN, psoriasis, presenting today for further evaluation of hematemesis.  Also discussed GERD, dysphagia, and lower abdominal pain.  Hematemesis: Single episode of hematemesis a couple months ago in the setting of nausea/vomiting for about 1  week.  Query whether he had Mallory-Weiss tear, but unable to rule out esophagitis, gastritis, duodenitis, PUD.  He does have chronic GERD but does not adequately manage and also reports history of H. pylori and esophageal ulcer.  Will update his CBC, start PPI, and arrange EGD for further evaluation.  GERD: Chronic.  Currently uncontrolled, off PPI.  Previously on omeprazole 40 mg daily which helped, but still had some breakthrough.  Will try him on a different PPI.  Notably, he does report history of H. pylori many years ago s/p treatment with an antibiotic.  Unclear if H. pylori was completely eradicated. We will need to confirm H. pylori eradication at the time of his upcoming EGD.  Dysphagia: Intermittent sensation of food sitting in his lower esophagus for a minute or so before it passes into his stomach.  Some mild pain with this.  This is in the setting of chronic GERD and patient also reporting history of esophageal ulcer many years ago.  Query esophagitis, esophageal web, ring, stricture in the setting of chronic GERD.  Unable to rule out malignancy.  He will need EGD for further evaluation.  Lower abdominal pain: Chronic intermittent lower abdominal pain/cramping in the morning prior to a bowel movement with  improvement thereafter.  Notes some increased bowel frequency on the days he has abdominal pain, but no real diarrhea.  No alarm symptoms.  Suspect IBS.  We will try him on dicyclomine for symptomatic management.  If persistent symptoms or development of alarm symptoms, would need to proceed with a colonoscopy.    Plan:  CBC Proceed with upper endoscopy +/- dilation with propofol by Dr. Abbey Chatters in near future. Will need gastric biopsies to confirm H. pylori eradication.  The risks, benefits, and alternatives have been discussed with the patient in detail. The patient states understanding and desires to proceed.  ASA 2 Start pantoprazole 40 mg daily 30 minutes before breakfast. Avoid NSAIDs. Dicyclomine 10 mg up to 3 times daily for abdominal pain.  Recommended starting with once in the morning and evening on the days that he has abdominal pain.  Hold in setting of constipation. Follow-up after EGD.   Aliene Altes, PA-C [facility] 08/14/2022

## 2022-08-12 NOTE — H&P (View-Only) (Signed)
 Referring Provider:  Bucio, Elsa C, FNP Primary Care Physician:  Bucio, Elsa C, FNP Primary Gastroenterologist:  Dr. Carver  Chief Complaint  Patient presents with   Abdominal Pain    Pain in stomach, coughing up blood. Had an ulcer a couple years ago.     HPI:   Preston Munoz is a 41 y.o. male presenting today at the request of Bucio, Elsa C, FNP for abdominal pain and hemorrhoids.  Reviewed care everywhere: RUQ [place] 07/08/22: Hyperechoic mass in the right hepatic lobe.  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  Recommended follow-up right upper quadrant ultrasound in 6-12 months.  Otherwise MRI abdomen could be considered.  Reviewed office visit with Elsa Bucio, FNP dated 07/01/2022.  Patient reported generalized abdominal pain, worse in the morning when having a bowel movement.  Also with nausea with hematemesis.  Reported history of stomach ulcers in the early 2000's.  He had generalized tenderness to palpation on exam.  Labs including CBC, CMP, lipase, amylase were ordered and patient was furred to GI.  Recommended continuing omeprazole 40 mg daily and adding famotidine 20 mg at bedtime.  Lab results were not included in referral.  Today: Developed nausea/vomiting for about 1 week a couple months ago.  He had 1 episode of hematemesis which scared him.  That is the first time it ever happened.  He does have history of an esophageal ulcer 10-15 years ago. Had EGD at [facility] and found. Was diagnosed with H pylori. Took an antibiotic. Has intermittent acid reflux every few days. No real pattern. No identified triggers. Was taking omeprazole 40 mg daily. Has been out for 2 weeks, worked, but not perfect. Still had some symptoms. No further nausea/vomiting. No upper abdominal pain. Feels like he can feel his food transition into his stomach. Will take a minute for his food to pass. Some mild pain with this.   Tries to avoid NSAIDs.  Rare goody powder use if he doesn't have Tylenol.   Has bad stomach cramping in the morning. Lower abdomen. Before, during, and after a BM. Eventually pain fades away. Warm water helps with this. No nausea/vomiting with this. Stools are sometimes liquidly and sometimes solid. No brbpr or melena. Started about 5 years or so age. Comes and goes. Not every day. Can go up to a few weeks without symptoms. When symptoms start, they last for a  couple of weeks. Usually just 1-2 BM in the morning. When having stomach issues, will have 3-4 BMs in the morning.   Denies hemorrhoids.   Was adopted. Doesn't know Fhx.  Past Medical History:  Diagnosis Date   ADD (attention deficit disorder)    Anxiety    Hypertension    Psoriasis     Past Surgical History:  Procedure Laterality Date   fracture nose surgery      Current Outpatient Medications  Medication Sig Dispense Refill   alprazolam (XANAX) 2 MG tablet Take 1 mg by mouth 2 (two) times daily as needed for anxiety.      amphetamine-dextroamphetamine (ADDERALL) 30 MG tablet Take 30 mg by mouth 3 (three) times daily. 1 in am, 1 at lunch and half tab at 4 pm     buPROPion (WELLBUTRIN XL) 150 MG 24 hr tablet Take 150 mg by mouth every morning.     buPROPion (WELLBUTRIN XL) 300 MG 24 hr tablet Take 300 mg by mouth daily.       dicyclomine (BENTYL) 10 MG capsule Take 1 capsule (10 mg total) by mouth up to 3 times daily for abdominal cramping. 90 capsule 1   escitalopram (LEXAPRO) 20 MG tablet Take 20 mg by mouth daily.     lisinopril (ZESTRIL) 20 MG tablet Take 20 mg by mouth daily.     pantoprazole (PROTONIX) 40 MG tablet Take 1 tablet (40 mg total) by mouth daily before breakfast. 30 tablet 3   No current facility-administered medications for this visit.    Allergies as of 08/14/2022 - Review Complete 08/14/2022  Allergen Reaction Noted   Remeron [mirtazapine]  06/25/2015    Family History  Adopted: Yes  Family history unknown: Yes     Social History   Socioeconomic History   Marital status: Single    Spouse name: Not on file   Number of children: Not on file   Years of education: Not on file   Highest education level: Not on file  Occupational History   Not on file  Tobacco Use   Smoking status: Every Day    Packs/day: 1.00    Years: 5.00    Additional pack years: 0.00    Total pack years: 5.00    Types: Cigarettes   Smokeless tobacco: Never  Vaping Use   Vaping Use: Never used  Substance and Sexual Activity   Alcohol use: No   Drug use: Yes    Types: Marijuana    Comment: denies use   Sexual activity: Not on file  Other Topics Concern   Not on file  Social History Narrative   Not on file   Social Determinants of Health   Financial Resource Strain: Not on file  Food Insecurity: Not on file  Transportation Needs: Not on file  Physical Activity: Not on file  Stress: Not on file  Social Connections: Not on file  Intimate Partner Violence: Not on file    Review of Systems: Gen: Denies any fever, chills, cold or flulike symptoms, presyncope, syncope. CV: Denies chest pain, heart palpitations. Resp: Denies shortness of breath, cough. GI: See HPI GU : Denies urinary burning, urinary frequency, urinary hesitancy MS: Denies joint pain. Derm: Has psoriasis. Psych: Denies depression, anxiety. Heme: See HPI  Physical Exam: BP 136/76 (BP Location: Right Arm, Patient Position: Sitting, Cuff Size: Normal)   Pulse 77   Temp (!) 97.5 F (36.4 C) (Temporal)   Ht 5' 7" (1.702 m)   Wt 207 lb 3.2 oz (94 kg)   SpO2 98%   BMI 32.45 kg/m  General:   Alert and oriented. Pleasant and cooperative. Well-nourished and well-developed.  Head:  Normocephalic and atraumatic. Eyes:  Without icterus, sclera clear and conjunctiva pink.  Ears:  Normal auditory acuity. Lungs:  Clear to auscultation bilaterally. No wheezes, rales, or rhonchi. No distress.  Heart:  S1, S2 present without murmurs appreciated.   Abdomen:  +BS, soft, non-tender and non-distended. No HSM noted. No guarding or rebound. No masses appreciated.  Rectal:  Deferred  Msk:  Symmetrical without gross deformities. Normal posture. Extremities:  Without edema. Neurologic:  Alert and  oriented x4;  grossly normal neurologically. Skin:  Intact without significant lesions or rashes. Psych:   Normal mood and affect.    Assessment:  41-year-old male with history of anxiety, ADD, HTN, psoriasis, presenting today for further evaluation of hematemesis.  Also discussed GERD, dysphagia, and lower abdominal pain.  Hematemesis: Single episode of hematemesis a couple months ago in the setting of nausea/vomiting for about 1   week.  Query whether he had Mallory-Weiss tear, but unable to rule out esophagitis, gastritis, duodenitis, PUD.  He does have chronic GERD but does not adequately manage and also reports history of H. pylori and esophageal ulcer.  Will update his CBC, start PPI, and arrange EGD for further evaluation.  GERD: Chronic.  Currently uncontrolled, off PPI.  Previously on omeprazole 40 mg daily which helped, but still had some breakthrough.  Will try him on a different PPI.  Notably, he does report history of H. pylori many years ago s/p treatment with an antibiotic.  Unclear if H. pylori was completely eradicated. We will need to confirm H. pylori eradication at the time of his upcoming EGD.  Dysphagia: Intermittent sensation of food sitting in his lower esophagus for a minute or so before it passes into his stomach.  Some mild pain with this.  This is in the setting of chronic GERD and patient also reporting history of esophageal ulcer many years ago.  Query esophagitis, esophageal web, ring, stricture in the setting of chronic GERD.  Unable to rule out malignancy.  He will need EGD for further evaluation.  Lower abdominal pain: Chronic intermittent lower abdominal pain/cramping in the morning prior to a bowel movement with  improvement thereafter.  Notes some increased bowel frequency on the days he has abdominal pain, but no real diarrhea.  No alarm symptoms.  Suspect IBS.  We will try him on dicyclomine for symptomatic management.  If persistent symptoms or development of alarm symptoms, would need to proceed with a colonoscopy.    Plan:  CBC Proceed with upper endoscopy +/- dilation with propofol by Dr. Carver in near future. Will need gastric biopsies to confirm H. pylori eradication.  The risks, benefits, and alternatives have been discussed with the patient in detail. The patient states understanding and desires to proceed.  ASA 2 Start pantoprazole 40 mg daily 30 minutes before breakfast. Avoid NSAIDs. Dicyclomine 10 mg up to 3 times daily for abdominal pain.  Recommended starting with once in the morning and evening on the days that he has abdominal pain.  Hold in setting of constipation. Follow-up after EGD.   Preston Reinertsen, PA-C [facility] 08/14/2022   

## 2022-08-14 ENCOUNTER — Encounter: Payer: Self-pay | Admitting: Gastroenterology

## 2022-08-14 ENCOUNTER — Encounter: Payer: Self-pay | Admitting: *Deleted

## 2022-08-14 ENCOUNTER — Ambulatory Visit: Payer: No Typology Code available for payment source | Admitting: Gastroenterology

## 2022-08-14 VITALS — BP 136/76 | HR 77 | Temp 97.5°F | Ht 67.0 in | Wt 207.2 lb

## 2022-08-14 DIAGNOSIS — Z8619 Personal history of other infectious and parasitic diseases: Secondary | ICD-10-CM

## 2022-08-14 DIAGNOSIS — Z8719 Personal history of other diseases of the digestive system: Secondary | ICD-10-CM

## 2022-08-14 DIAGNOSIS — K219 Gastro-esophageal reflux disease without esophagitis: Secondary | ICD-10-CM | POA: Diagnosis not present

## 2022-08-14 DIAGNOSIS — K920 Hematemesis: Secondary | ICD-10-CM

## 2022-08-14 DIAGNOSIS — R1310 Dysphagia, unspecified: Secondary | ICD-10-CM

## 2022-08-14 DIAGNOSIS — R1030 Lower abdominal pain, unspecified: Secondary | ICD-10-CM

## 2022-08-14 MED ORDER — PANTOPRAZOLE SODIUM 40 MG PO TBEC: 40.0000 mg | DELAYED_RELEASE_TABLET | Freq: Every day | ORAL | 3 refills | Status: DC

## 2022-08-14 MED ORDER — DICYCLOMINE HCL 10 MG PO CAPS: ORAL_CAPSULE | ORAL | 1 refills | Status: DC

## 2022-08-14 NOTE — Patient Instructions (Addendum)
Please have blood work completed at [facility].  We will arrange for you to have an upper endoscopy with possible stretching of your esophagus in the near future with Dr. Abbey Chatters at [facility].  Start pantoprazole 40 mg daily 30 minutes before breakfast.  I have sent a prescription to your pharmacy.  Avoid all NSAID products including ibuprofen, Aleve, Advil, naproxen, BC powders, Goody powders, and anything that says "NSAID" on the package.  I suspect your lower abdominal pain is likely secondary to IBS. Start using dicyclomine 10 mg up to 3 times daily for abdominal pain.  This is not something that you have to take every day.  You can use it only on the days that you are having abdominal pain.  I recommend that you start taking it once in the morning and once in the evening on the days that you have pain. If you develop constipation while taking dicyclomine, hold the medication, and constipation will resolve.  We will plan to follow-up with you in the office after your upper endoscopy.  Do not hesitate to call sooner if you have questions or concerns.  It was a pleasure to see you today! I want to create trusting relationships with patients. If you receive a survey regarding your visit,  I greatly appreciate you taking time to fill this out on paper or through your MyChart. I value your feedback.  Aliene Altes, PA-C [facility]

## 2022-09-02 ENCOUNTER — Ambulatory Visit (HOSPITAL_COMMUNITY)
Admission: RE | Admit: 2022-09-02 | Discharge: 2022-09-02 | Disposition: A | Discharge status: Home / Self Care | Payer: No Typology Code available for payment source | Attending: Internal Medicine | Admitting: Internal Medicine

## 2022-09-02 ENCOUNTER — Ambulatory Visit (HOSPITAL_COMMUNITY): Payer: No Typology Code available for payment source | Admitting: Certified Registered Nurse Anesthetist

## 2022-09-02 ENCOUNTER — Other Ambulatory Visit: Payer: Self-pay

## 2022-09-02 ENCOUNTER — Encounter (HOSPITAL_COMMUNITY)
Admission: RE | Disposition: A | Discharge status: Home / Self Care | Payer: Self-pay | Source: Home / Self Care | Attending: Internal Medicine

## 2022-09-02 ENCOUNTER — Encounter (HOSPITAL_COMMUNITY): Payer: Self-pay

## 2022-09-02 DIAGNOSIS — F419 Anxiety disorder, unspecified: Secondary | ICD-10-CM | POA: Insufficient documentation

## 2022-09-02 DIAGNOSIS — R1310 Dysphagia, unspecified: Secondary | ICD-10-CM | POA: Insufficient documentation

## 2022-09-02 DIAGNOSIS — K2970 Gastritis, unspecified, without bleeding: Secondary | ICD-10-CM

## 2022-09-02 DIAGNOSIS — K2289 Other specified disease of esophagus: Secondary | ICD-10-CM | POA: Insufficient documentation

## 2022-09-02 DIAGNOSIS — F17210 Nicotine dependence, cigarettes, uncomplicated: Secondary | ICD-10-CM | POA: Diagnosis not present

## 2022-09-02 DIAGNOSIS — Z79899 Other long term (current) drug therapy: Secondary | ICD-10-CM | POA: Diagnosis not present

## 2022-09-02 DIAGNOSIS — K317 Polyp of stomach and duodenum: Secondary | ICD-10-CM | POA: Insufficient documentation

## 2022-09-02 DIAGNOSIS — K219 Gastro-esophageal reflux disease without esophagitis: Secondary | ICD-10-CM

## 2022-09-02 DIAGNOSIS — I10 Essential (primary) hypertension: Secondary | ICD-10-CM | POA: Diagnosis not present

## 2022-09-02 DIAGNOSIS — K920 Hematemesis: Secondary | ICD-10-CM

## 2022-09-02 HISTORY — PX: BALLOON DILATION: SHX5330

## 2022-09-02 HISTORY — PX: BIOPSY: SHX5522

## 2022-09-02 HISTORY — PX: ESOPHAGOGASTRODUODENOSCOPY (EGD) WITH PROPOFOL: SHX5813

## 2022-09-02 SURGERY — ESOPHAGOGASTRODUODENOSCOPY (EGD) WITH PROPOFOL
Anesthesia: General

## 2022-09-02 MED ORDER — LIDOCAINE HCL (CARDIAC) PF 100 MG/5ML IV SOSY
PREFILLED_SYRINGE | INTRAVENOUS | Status: DC | PRN
  Administered 2022-09-02: 50 mg via INTRATRACHEAL

## 2022-09-02 MED ORDER — LACTATED RINGERS IV SOLN: INTRAVENOUS | Status: DC

## 2022-09-02 MED ORDER — PROPOFOL 10 MG/ML IV BOLUS
INTRAVENOUS | Status: DC | PRN
  Administered 2022-09-02 (×2): 50 mg via INTRAVENOUS
  Administered 2022-09-02: 100 mg via INTRAVENOUS
  Administered 2022-09-02: 50 mg via INTRAVENOUS

## 2022-09-02 MED ORDER — PANTOPRAZOLE SODIUM 40 MG PO TBEC
40.0000 mg | DELAYED_RELEASE_TABLET | Freq: Two times a day (BID) | ORAL | 11 refills | Status: AC
Start: 2022-09-02 — End: 2023-09-02

## 2022-09-02 MED ORDER — LACTATED RINGERS IV SOLN: INTRAVENOUS | Status: DC | PRN

## 2022-09-02 NOTE — Anesthesia Preprocedure Evaluation (Signed)
Anesthesia Evaluation  Patient identified by MRN, date of birth, ID band Patient awake    Reviewed: Allergy & Precautions, H&P , NPO status , Patient's Chart, lab work & pertinent test results, reviewed documented beta blocker date and time   Airway Mallampati: II  TM Distance: >3 FB Neck ROM: full    Dental no notable dental hx.    Pulmonary neg pulmonary ROS, former smoker   Pulmonary exam normal breath sounds clear to auscultation       Cardiovascular Exercise Tolerance: Good hypertension, negative cardio ROS  Rhythm:regular Rate:Normal     Neuro/Psych  PSYCHIATRIC DISORDERS Anxiety     negative neurological ROS  negative psych ROS   GI/Hepatic negative GI ROS, Neg liver ROS,GERD  ,,  Endo/Other  negative endocrine ROS    Renal/GU negative Renal ROS  negative genitourinary   Musculoskeletal   Abdominal   Peds  Hematology negative hematology ROS (+)   Anesthesia Other Findings   Reproductive/Obstetrics negative OB ROS                             Anesthesia Physical Anesthesia Plan  ASA: 2  Anesthesia Plan: General   Post-op Pain Management:    Induction:   PONV Risk Score and Plan: Propofol infusion  Airway Management Planned:   Additional Equipment:   Intra-op Plan:   Post-operative Plan:   Informed Consent: I have reviewed the patients History and Physical, chart, labs and discussed the procedure including the risks, benefits and alternatives for the proposed anesthesia with the patient or authorized representative who has indicated his/her understanding and acceptance.     Dental Advisory Given  Plan Discussed with: CRNA  Anesthesia Plan Comments:        Anesthesia Quick Evaluation

## 2022-09-02 NOTE — Op Note (Signed)
[facility] Patient Name: Preston Munoz Procedure Date: 09/02/2022 11:58 AM MRN: [id number] Date of Birth: 02-Dec-1980 Attending MD: Elon Alas. Abbey Chatters , [place], [id number] CSN: [id number] Age: 42 Admit Type: Outpatient Procedure:                Upper GI endoscopy Indications:              Dysphagia, Heartburn Providers:                Elon Alas. Abbey Chatters, DO, Caprice Kluver, [place], Technician Referring MD:              Medicines:                See the Anesthesia note for documentation of the                            administered medications Complications:            No immediate complications. Estimated Blood Loss:     Estimated blood loss was minimal. Procedure:                Pre-Anesthesia Assessment:                           - The anesthesia plan was to use monitored                            anesthesia care (MAC).                           After obtaining informed consent, the endoscope was                            passed under direct vision. Throughout the                            procedure, the patient's blood pressure, pulse, and                            oxygen saturations were monitored continuously. The                            [id number] scope was introduced through the                            mouth, and advanced to the second part of duodenum.                            The upper GI endoscopy was accomplished without                            difficulty. The patient tolerated the procedure                            well. Scope In: 12:21:40 PM  Scope Out: 12:27:33 PM Total Procedure Duration: 0 hours 5 minutes 53 seconds  Findings:      Mucosal changes including ringed esophagus were found in the middle       third of the esophagus. Biopsies were taken with a cold forceps for       histology.      Preparations were made for empiric dilation. A TTS dilator was passed       through the scope. Dilation  with an 18-19-20 mm balloon dilator was       performed to 20 mm. Dilation was performed with a mild resistance at 20       mm. Estimated blood loss was none.      Patchy mild inflammation characterized by erythema was found in the       gastric body and in the gastric antrum. Biopsies were taken with a cold       forceps for Helicobacter pylori testing.      The duodenal bulb, first portion of the duodenum and second portion of       the duodenum were normal. Impression:               - Esophageal mucosal changes. Biopsied.                           - Gastritis. Biopsied.                           - Normal duodenal bulb, first portion of the                            duodenum and second portion of the duodenum. Moderate Sedation:      Per Anesthesia Care Recommendation:           - Patient has a contact number available for                            emergencies. The signs and symptoms of potential                            delayed complications were discussed with the                            patient. Return to normal activities tomorrow.                            Written discharge instructions were provided to the                            patient.                           - Resume previous diet.                           - Continue present medications.                           - Await pathology results.                           -  Repeat upper endoscopy PRN for retreatment.                           - Return to GI clinic in 3 months.                           - Use Protonix (pantoprazole) 40 mg PO BID for 12                            weeks. Procedure Code(s):        --- Professional ---                           [phone], Esophagogastroduodenoscopy, flexible,                            transoral; with biopsy, single or multiple Diagnosis Code(s):        --- Professional ---                           K22.89, Other specified disease of esophagus                            K29.70, Gastritis, unspecified, without bleeding                           R13.10, Dysphagia, unspecified                           R12, Heartburn CPT copyright 2022 American Medical Association. All rights reserved. The codes documented in this report are preliminary and upon coder review may  be revised to meet current compliance requirements. Elon Alas. Abbey Chatters, DO [place] Abbey Chatters, DO 09/02/2022 12:33:33 PM This report has been signed electronically. Number of Addenda: 0

## 2022-09-02 NOTE — Discharge Instructions (Addendum)
EGD Discharge instructions Please read the instructions outlined below and refer to this sheet in the next few weeks. These discharge instructions provide you with general information on caring for yourself after you leave the hospital. Your doctor may also give you specific instructions. While your treatment has been planned according to the most current medical practices available, unavoidable complications occasionally occur. If you have any problems or questions after discharge, please call your doctor. ACTIVITY You may resume your regular activity but move at a slower pace for the next 24 hours.  Take frequent rest periods for the next 24 hours.  Walking will help expel (get rid of) the air and reduce the bloated feeling in your abdomen.  No driving for 24 hours (because of the anesthesia (medicine) used during the test).  You may shower.  Do not sign any important legal documents or operate any machinery for 24 hours (because of the anesthesia used during the test).  NUTRITION Drink plenty of fluids.  You may resume your normal diet.  Begin with a light meal and progress to your normal diet.  Avoid alcoholic beverages for 24 hours or as instructed by your caregiver.  MEDICATIONS You may resume your normal medications unless your caregiver tells you otherwise.  WHAT YOU CAN EXPECT TODAY You may experience abdominal discomfort such as a feeling of fullness or "gas" pains.  FOLLOW-UP Your doctor will discuss the results of your test with you.  SEEK IMMEDIATE MEDICAL ATTENTION IF ANY OF THE FOLLOWING OCCUR: Excessive nausea (feeling sick to your stomach) and/or vomiting.  Severe abdominal pain and distention (swelling).  Trouble swallowing.  Temperature over 101 F (37.8 C).  Rectal bleeding or vomiting of blood.   Your EGD revealed mild amount inflammation in your stomach.  I took biopsies of this to rule out infection with a bacteria called H. pylori.  I also took samples of your  esophagus as well.  Await pathology results, my office will contact you.  I did stretch your esophagus today.  Hopefully this helps with the feeling of food getting stuck in your substernal region.  Small bowel appeared normal.  I am going to increase your pantoprazole to twice daily for the next 12 weeks at which point you can decrease down to once daily.  Follow-up with GI in 2 to 3 months.     I hope you have a great rest of your week!  Elon Alas. Abbey Chatters, D.O. Gastroenterology and Hepatology [facility]

## 2022-09-02 NOTE — Transfer of Care (Signed)
Immediate Anesthesia Transfer of Care Note  Patient: Preston Munoz  Procedure(s) Performed: ESOPHAGOGASTRODUODENOSCOPY (EGD) WITH PROPOFOL BALLOON DILATION BIOPSY  Patient Location: Endoscopy Unit  Anesthesia Type:General  Level of Consciousness: drowsy  Airway & Oxygen Therapy: Patient Spontanous Breathing  Post-op Assessment: Report given to RN and Post -op Vital signs reviewed and stable  Post vital signs: Reviewed and stable  Last Vitals:  Vitals Value Taken Time  BP 108/61 09/02/22 1230  Temp 36.5 C 09/02/22 1230  Pulse 91 09/02/22 1230  Resp 20 09/02/22 1230  SpO2 95 % 09/02/22 1230    Last Pain:  Vitals:   09/02/22 1230  TempSrc: Axillary  PainSc: 0-No pain         Complications: No notable events documented.

## 2022-09-02 NOTE — Interval H&P Note (Signed)
History and Physical Interval Note:  09/02/2022 11:59 AM  Preston Munoz  has presented today for surgery, with the diagnosis of dysphagia,hematemesis,GERD.  The various methods of treatment have been discussed with the patient and family. After consideration of risks, benefits and other options for treatment, the patient has consented to  Procedure(s) with comments: ESOPHAGOGASTRODUODENOSCOPY (EGD) WITH PROPOFOL (N/A) - 1:45 pm, MB full can't reach pt to move up [place] (N/A) as a surgical intervention.  The patient's history has been reviewed, patient examined, no change in status, stable for surgery.  I have reviewed the patient's chart and labs.  Questions were answered to the patient's satisfaction.     Eloise Harman

## 2022-09-04 NOTE — Anesthesia Postprocedure Evaluation (Signed)
Anesthesia Post Note  Patient: Preston Munoz  Procedure(s) Performed: ESOPHAGOGASTRODUODENOSCOPY (EGD) WITH PROPOFOL BALLOON DILATION BIOPSY  Patient location during evaluation: Phase II Anesthesia Type: General Level of consciousness: awake Pain management: pain level controlled Vital Signs Assessment: post-procedure vital signs reviewed and stable Respiratory status: spontaneous breathing and respiratory function stable Cardiovascular status: blood pressure returned to baseline and stable Postop Assessment: no headache and no apparent nausea or vomiting Anesthetic complications: no Comments: Late entry   No notable events documented.   Last Vitals:  Vitals:   09/02/22 1207 09/02/22 1230  BP: (!) 157/99 108/61  Pulse: 90 91  Resp: (!) 22 20  Temp: 36.9 C 36.5 C  SpO2: 96% 95%    Last Pain:  Vitals:   09/02/22 1230  TempSrc: Axillary  PainSc: 0-No pain                 Louann Sjogren

## 2022-09-07 LAB — SURGICAL PATHOLOGY

## 2022-09-09 ENCOUNTER — Encounter (HOSPITAL_COMMUNITY): Payer: Self-pay | Admitting: Internal Medicine

## 2022-10-19 ENCOUNTER — Other Ambulatory Visit: Payer: Self-pay | Admitting: Gastroenterology

## 2022-10-19 DIAGNOSIS — R1030 Lower abdominal pain, unspecified: Secondary | ICD-10-CM

## 2022-10-23 ENCOUNTER — Encounter: Payer: Self-pay | Admitting: Gastroenterology

## 2023-03-08 NOTE — Progress Notes (Deleted)
Referring Provider: Shelby Dubin, FNP Primary Care Physician:  Shelby Dubin, FNP Primary GI Physician: Dr. Marletta Lor  No chief complaint on file.   HPI:   Preston Munoz is a 42 y.o. male with history of anxiety, ADD, HTN, psoriasis, presenting today for follow-up of hematemesis, GERD, dysphagia, lower abdominal pain.  Last seen in the office at the time of initial consult 08/14/2022.  He reported single episode of hematemesis a couple months ago in the setting of nausea/vomiting x 1 week.  Reported chronic GERD that was uncontrolled, off PPI.  Previously on omeprazole 40 mg daily which helps some, but still with breakthrough.  Also with intermittent dysphagia.   He also reported history of H. pylori years ago treated with antibiotics along with history of esophageal ulcer. Also reported chronic intermittent lower abdominal pain/cramping prior to bowel movements with improvement thereafter.  Noted increased bowel frequency on the days he had abdominal pain, but no real diarrhea.  Suspected IBS.  Plan included updating CBC, EGD with possible dilation, start pantoprazole 40 mg daily, start dicyclomine 10 mg up to 3 times daily.  EGD 09/02/2022: Esophageal mucosal changes including ringed esophagus biopsied.  Esophagus also dilated.  Gastritis biopsied.  Normal examined duodenum.  Recommended PPI twice daily and follow-up in 3 months.  Esophageal changes consistent with reflux changes, gastric biopsy with mild chronic inflammation and proton pump inhibitor type effect/early fundic gland polyp-like changes.  H. pylori negative.    Today:  GERD:  Dysphagia:  Lower abdominal pain:   Also with history of liver lesions.  RUQ [place] 07/08/22 (Care Everywhere): Hyperechoic mass in the right hepatic lobe.  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  Recommended follow-up right upper quadrant ultrasound in 6-12 months.  Otherwise MRI abdomen  could be considered. ***  Past Medical History:  Diagnosis Date   ADD (attention deficit disorder)    Anxiety    Hypertension    Psoriasis     Past Surgical History:  Procedure Laterality Date   BALLOON DILATION N/A 09/02/2022   Procedure: BALLOON DILATION;  Surgeon: Lanelle Bal, DO;  [facility];  Service: Endoscopy;  Laterality: N/A;   BIOPSY  09/02/2022   Procedure: BIOPSY;  Surgeon: Lanelle Bal, DO;  [facility];  Service: Endoscopy;;   ESOPHAGOGASTRODUODENOSCOPY (EGD) WITH PROPOFOL N/A 09/02/2022   Procedure: ESOPHAGOGASTRODUODENOSCOPY (EGD) WITH PROPOFOL;  Surgeon: Lanelle Bal, DO;  [facility];  Service: Endoscopy;  Laterality: N/A;  1:45 pm, MB full can't reach pt to move up   fracture nose surgery      Current Outpatient Medications  Medication Sig Dispense Refill   alprazolam (XANAX) 2 MG tablet Take 2 mg by mouth 2 (two) times daily as needed for anxiety.     amphetamine-dextroamphetamine (ADDERALL) 30 MG tablet Take 30 mg by mouth 2 (two) times daily.     buPROPion (WELLBUTRIN XL) 150 MG 24 hr tablet Take 150 mg by mouth daily.     buPROPion (WELLBUTRIN XL) 300 MG 24 hr tablet Take 300 mg by mouth daily.     dicyclomine (BENTYL) 10 MG capsule TAKE 1 CAPSULE BY MOUTH THREE TIMES DAILY FOR  ABDOMINAL  CRAMPING 90 capsule 0   escitalopram (LEXAPRO) 20 MG tablet Take 20 mg by mouth daily.     lisinopril (ZESTRIL) 20 MG tablet Take 20 mg by mouth daily.  pantoprazole (PROTONIX) 40 MG tablet Take 1 tablet (40 mg total) by mouth 2 (two) times daily. 60 tablet 11   No current facility-administered medications for this visit.    Allergies as of 03/11/2023 - Review Complete 09/02/2022  Allergen Reaction Noted   Remeron [mirtazapine]  06/25/2015    Family History  Adopted: Yes  Family history unknown: Yes    Social History   Socioeconomic History   Marital status: Single    Spouse name: Not on file   Number of  children: Not on file   Years of education: Not on file   Highest education level: Not on file  Occupational History   Not on file  Tobacco Use   Smoking status: Former    Current packs/day: 1.00    Average packs/day: 1 pack/day for 5.0 years (5.0 ttl pk-yrs)    Types: Cigarettes   Smokeless tobacco: Never  Vaping Use   Vaping status: Never Used  Substance and Sexual Activity   Alcohol use: No   Drug use: Not Currently    Types: Marijuana    Comment: denies use   Sexual activity: Not on file  Other Topics Concern   Not on file  Social History Narrative   Not on file   Social Determinants of Health   Financial Resource Strain: Not on file  Food Insecurity: Not on file  Transportation Needs: Not on file  Physical Activity: Not on file  Stress: Not on file  Social Connections: Not on file    Review of Systems: Gen: Denies fever, chills, anorexia. Denies fatigue, weakness, weight loss.  CV: Denies chest pain, palpitations, syncope, peripheral edema, and claudication. Resp: Denies dyspnea at rest, cough, wheezing, coughing up blood, and pleurisy. GI: Denies vomiting blood, jaundice, and fecal incontinence.   Denies dysphagia or odynophagia. Derm: Denies rash, itching, dry skin Psych: Denies depression, anxiety, memory loss, confusion. No homicidal or suicidal ideation.  Heme: Denies bruising, bleeding, and enlarged lymph nodes.  Physical Exam: There were no vitals taken for this visit. General:   Alert and oriented. No distress noted. Pleasant and cooperative.  Head:  Normocephalic and atraumatic. Eyes:  Conjuctiva clear without scleral icterus. Heart:  S1, S2 present without murmurs appreciated. Lungs:  Clear to auscultation bilaterally. No wheezes, rales, or rhonchi. No distress.  Abdomen:  +BS, soft, non-tender and non-distended. No rebound or guarding. No HSM or masses noted. Msk:  Symmetrical without gross deformities. Normal posture. Extremities:  Without  edema. Neurologic:  Alert and  oriented x4 Psych:  Normal mood and affect.    Assessment:     Plan:  ***   Ermalinda Memos, PA-C [facility] 03/11/2023

## 2023-03-11 ENCOUNTER — Ambulatory Visit: Payer: No Typology Code available for payment source | Admitting: Gastroenterology

## 2023-03-11 ENCOUNTER — Encounter: Payer: Self-pay | Admitting: Gastroenterology

## 2024-01-25 ENCOUNTER — Ambulatory Visit (HOSPITAL_COMMUNITY)
Admission: EM | Admit: 2024-01-25 | Discharge: 2024-01-25 | Disposition: A | Discharge status: Home / Self Care | Attending: Family Medicine | Admitting: Family Medicine

## 2024-01-25 ENCOUNTER — Encounter (HOSPITAL_COMMUNITY): Payer: Self-pay | Admitting: *Deleted

## 2024-01-25 DIAGNOSIS — S80811A Abrasion, right lower leg, initial encounter: Secondary | ICD-10-CM

## 2024-01-25 DIAGNOSIS — Z23 Encounter for immunization: Secondary | ICD-10-CM

## 2024-01-25 MED ORDER — TETANUS-DIPHTH-ACELL PERTUSSIS 5-2.5-18.5 LF-MCG/0.5 IM SUSY
0.5000 mL | PREFILLED_SYRINGE | Freq: Once | INTRAMUSCULAR | Status: AC
  Administered 2024-01-25: 0.5 mL via INTRAMUSCULAR

## 2024-01-25 MED ORDER — TETANUS-DIPHTH-ACELL PERTUSSIS 5-2.5-18.5 LF-MCG/0.5 IM SUSY
PREFILLED_SYRINGE | INTRAMUSCULAR | Status: AC
  Filled 2024-01-25: qty 0.5

## 2024-01-25 NOTE — Discharge Instructions (Signed)
 You have been given a Tdap vaccination to boost your tetanus immunity  1-2 times daily, wash the wound with soapy water (or peroxide) and put fresh triple antibiotic ointment on and a clean bandage (nonstick gauze best).  Ice and elevate your leg today.   Tylenol  or ibuprofen will help.

## 2024-01-25 NOTE — ED Triage Notes (Signed)
 Reports slipping on a truck ramp at work @ approx 0730 this AM, sustaining large irregular skin avulsion and abrasions to right anterior lower leg. Bleeding  controlled. Ambulatory without difficulty.

## 2024-01-25 NOTE — ED Provider Notes (Signed)
 [facility]    CSN: [id number] Arrival date & time: 01/25/24  9158      History   Chief Complaint Chief Complaint  Patient presents with   Leg Wound    HPI Preston Munoz is a 43 y.o. male.   HPI Here for cuts on right leg. Sustained when slipped on a ramp this AM.  Allergic to remeron.  Last tetanus booster was 06/2023. Past Medical History:  Diagnosis Date   ADD (attention deficit disorder)    Anxiety    Hypertension    Psoriasis     Patient Active Problem List   Diagnosis Date Noted   Gastroesophageal reflux disease 08/14/2022   History of Helicobacter pylori infection 08/14/2022   Hematemesis with nausea 08/14/2022   Dysphagia 08/14/2022   Lower abdominal pain 08/14/2022   History of esophageal ulcer 08/14/2022    Past Surgical History:  Procedure Laterality Date   BALLOON DILATION N/A 09/02/2022   Procedure: BALLOON DILATION;  Surgeon: Cindie Carlin POUR, DO;  [facility];  Service: Endoscopy;  Laterality: N/A;   BIOPSY  09/02/2022   Procedure: BIOPSY;  Surgeon: Cindie Carlin POUR, DO;  [facility];  Service: Endoscopy;;   ESOPHAGOGASTRODUODENOSCOPY (EGD) WITH PROPOFOL  N/A 09/02/2022   Procedure: ESOPHAGOGASTRODUODENOSCOPY (EGD) WITH PROPOFOL ;  Surgeon: Cindie Carlin POUR, DO;  [facility];  Service: Endoscopy;  Laterality: N/A;  1:45 pm, MB full can't reach pt to move up   fracture nose surgery         Home Medications    Prior to Admission medications   Medication Sig Start Date End Date Taking? Authorizing Provider  alprazolam (XANAX) 2 MG tablet Take 2 mg by mouth 2 (two) times daily as needed for anxiety.   Yes [provider]  amphetamine-dextroamphetamine (ADDERALL) 30 MG tablet Take 30 mg by mouth 2 (two) times daily.   Yes [provider]  lisinopril (ZESTRIL) 20 MG tablet Take 20 mg by mouth daily. 07/28/22  Yes [provider]  buPROPion (WELLBUTRIN XL) 150 MG 24 hr  tablet Take 150 mg by mouth daily. 07/20/22   [provider]  buPROPion (WELLBUTRIN XL) 300 MG 24 hr tablet Take 300 mg by mouth daily. 07/20/22   [provider]  dicyclomine  (BENTYL ) 10 MG capsule TAKE 1 CAPSULE BY MOUTH THREE TIMES DAILY FOR  ABDOMINAL  CRAMPING 10/20/22   Rudy Josette RAMAN, PA-C  escitalopram (LEXAPRO) 20 MG tablet Take 20 mg by mouth daily.    [provider]  pantoprazole  (PROTONIX ) 40 MG tablet Take 1 tablet (40 mg total) by mouth 2 (two) times daily. 09/02/22 09/02/23  Cindie Carlin POUR, DO    Family History Family History  Adopted: Yes  Family history unknown: Yes    Social History Social History   Tobacco Use   Smoking status: Former    Current packs/day: 1.00    Average packs/day: 1 pack/day for 5.0 years (5.0 ttl pk-yrs)    Types: Cigarettes   Smokeless tobacco: Never  Vaping Use   Vaping status: Never Used  Substance Use Topics   Alcohol use: No   Drug use: Not Currently    Types: Marijuana    Comment: occasionally     Allergies   Remeron [mirtazapine]   Review of Systems Review of Systems   Physical Exam Triage Vital Signs ED Triage Vitals  Encounter Vitals Group     BP 01/25/24 1004 (!) 162/100     Girls  Systolic BP Percentile --      Girls Diastolic BP Percentile --      Boys Systolic BP Percentile --      Boys Diastolic BP Percentile --      Pulse Rate 01/25/24 1004 70     Resp 01/25/24 1004 16     Temp 01/25/24 1004 98 F (36.7 C)     Temp Source 01/25/24 1004 Oral     SpO2 01/25/24 1004 97 %     Weight --      Height --      Head Circumference --      Peak Flow --      Pain Score 01/25/24 1005 5     Pain Loc --      Pain Education --      Exclude from Growth Chart --    No data found.  Updated Vital Signs BP (!) 162/100 Comment: believes he missed HTN med dose today  Pulse 70   Temp 98 F (36.7 C) (Oral)   Resp 16   SpO2 97%   Visual Acuity Right Eye Distance:   Left Eye Distance:    Bilateral Distance:    Right Eye Near:   Left Eye Near:    Bilateral Near:     Physical Exam Vitals reviewed.  Constitutional:      General: He is not in acute distress.    Appearance: He is not ill-appearing, toxic-appearing or diaphoretic.  Skin:    Coloration: Skin is not jaundiced or pale.     Comments: On the anterior right lower leg there are several linear abrasions in a vertical orientation. Staff had cleaned them prior to exam. Tiny bit of oozing. Further cleansing done, and a black piece of gravel (about 1.5 cm x 4 mm) was removed from one of the deeper wounds. 2 of the wounds are 2-3 mm deep, but most are very shallow. Skin edges of the deeper wounds do not approximate with pressure. The 2 deeper parts are each about 3 cm in length.    Neurological:     General: No focal deficit present.     Mental Status: He is alert and oriented to person, place, and time.  Psychiatric:        Behavior: Behavior normal.      UC Treatments / Results  Labs (all labs ordered are listed, but only abnormal results are displayed) Labs Reviewed - No data to display  EKG   Radiology No results found.  Procedures Procedures (including critical care time)  Medications Ordered in UC Medications  Tdap (BOOSTRIX ) injection 0.5 mL (has no administration in time range)    Initial Impression / Assessment and Plan / UC Course  I have reviewed the triage vital signs and the nursing notes.  Pertinent labs & imaging results that were available during my care of the patient were reviewed by me and considered in my medical decision making (see chart for details).     Saline irrigation done of the wounds, and bandage applied with bacitracin  and nonstick gauze and coban. Wound care explained. Tdap given.   Final Clinical Impressions(s) / UC Diagnoses   Final diagnoses:  Abrasion of right lower leg, initial encounter     Discharge Instructions      You have been given a Tdap  vaccination to boost your tetanus immunity  1-2 times daily, wash the wound with soapy water (or peroxide) and put fresh triple antibiotic ointment on and a clean  bandage (nonstick gauze best).  Ice and elevate your leg today.   Tylenol  or ibuprofen will help.      ED Prescriptions   None    PDMP not reviewed this encounter.   Vonna Sharlet POUR, MD 01/25/24 1051
